# Patient Record
Sex: Female | Born: 1965 | Race: White | Hispanic: No | Smoking: Former smoker
Health system: Southern US, Community
[De-identification: ages and names within clinical notes are randomized; demographics above are authoritative.]

## PROBLEM LIST (undated history)

## (undated) DIAGNOSIS — I739 Peripheral vascular disease, unspecified: Secondary | ICD-10-CM

## (undated) HISTORY — DX: Peripheral vascular disease, unspecified: I73.9

---

## 2009-03-11 ENCOUNTER — Emergency Department (HOSPITAL_BASED_OUTPATIENT_CLINIC_OR_DEPARTMENT_OTHER): Admission: EM | Admit: 2009-03-11 | Discharge: 2009-03-11 | Payer: Self-pay | Admitting: Emergency Medicine

## 2010-11-05 LAB — URINALYSIS, ROUTINE W REFLEX MICROSCOPIC
Ketones, ur: 40 mg/dL — AB
Nitrite: POSITIVE — AB
Protein, ur: >300 mg/dL — AB
Specific Gravity, Urine: 1.022 (ref 1.005–1.030)
Urobilinogen, UA: 4 mg/dL — ABNORMAL HIGH (ref 0.0–1.0)

## 2010-11-05 LAB — URINE MICROSCOPIC-ADD ON

## 2015-06-13 DIAGNOSIS — M67431 Ganglion, right wrist: Secondary | ICD-10-CM | POA: Insufficient documentation

## 2019-10-23 ENCOUNTER — Other Ambulatory Visit: Payer: Self-pay

## 2019-10-23 ENCOUNTER — Encounter (HOSPITAL_BASED_OUTPATIENT_CLINIC_OR_DEPARTMENT_OTHER): Payer: Self-pay

## 2019-10-23 ENCOUNTER — Emergency Department (HOSPITAL_BASED_OUTPATIENT_CLINIC_OR_DEPARTMENT_OTHER)
Admission: EM | Admit: 2019-10-23 | Discharge: 2019-10-23 | Disposition: A | Discharge status: Home / Self Care | Payer: Self-pay | Attending: Emergency Medicine | Admitting: Emergency Medicine

## 2019-10-23 DIAGNOSIS — N3001 Acute cystitis with hematuria: Secondary | ICD-10-CM

## 2019-10-23 DIAGNOSIS — R109 Unspecified abdominal pain: Secondary | ICD-10-CM

## 2019-10-23 DIAGNOSIS — N3091 Cystitis, unspecified with hematuria: Secondary | ICD-10-CM | POA: Insufficient documentation

## 2019-10-23 DIAGNOSIS — F17210 Nicotine dependence, cigarettes, uncomplicated: Secondary | ICD-10-CM | POA: Insufficient documentation

## 2019-10-23 LAB — URINALYSIS, ROUTINE W REFLEX MICROSCOPIC
Bilirubin Urine: NEGATIVE
Glucose, UA: NEGATIVE mg/dL
Ketones, ur: NEGATIVE mg/dL
Leukocytes,Ua: NEGATIVE
Nitrite: NEGATIVE
Protein, ur: NEGATIVE mg/dL
Specific Gravity, Urine: >1.03 — ABNORMAL HIGH (ref 1.005–1.030)
pH: 5.5 (ref 5.0–8.0)

## 2019-10-23 LAB — URINALYSIS, MICROSCOPIC (REFLEX)

## 2019-10-23 MED ORDER — CEPHALEXIN 500 MG PO CAPS: 500.0000 mg | ORAL_CAPSULE | Freq: Two times a day (BID) | ORAL | 0 refills | Status: AC

## 2019-10-23 MED ORDER — CEPHALEXIN 250 MG PO CAPS
500.0000 mg | ORAL_CAPSULE | Freq: Once | ORAL | Status: AC
  Administered 2019-10-23: 20:00:00 500 mg via ORAL
  Filled 2019-10-23: qty 2

## 2019-10-23 NOTE — ED Provider Notes (Signed)
[facility] EMERGENCY DEPARTMENT Provider Note   CSN: [id number] Arrival date & time: 10/23/19  1718     History Chief Complaint  Patient presents with  . Dysuria    Tiffany Forbes is a 54 y.o. female with no pertinent past medical history who presents today for evaluation of urinary symptoms. She reports that for the past 6 days she has had dysuria, urinary frequency and urgency.  She denies hematuria.  She reports that 2 days ago she developed pain in her left flank.  The pain does not radiate or move.  She denies any history of renal stones.  She has not recorded any fevers.  She has tried Azo with minimal relief of her symptoms.  She has not taken any ibuprofen or Tylenol today.  She denies any cough or shortness of breath.  No chest pain.  She reports she did vomit once on Wednesday however reports that was isolated.   She is postmenopausal.  HPI     History reviewed. No pertinent past medical history.  There are no problems to display for this patient.   History reviewed. No pertinent surgical history.   OB History   No obstetric history on file.     No family history on file.  Social History   Tobacco Use  . Smoking status: Current Every Day Smoker    Types: Cigarettes  . Smokeless tobacco: Never Used  Substance Use Topics  . Alcohol use: Never  . Drug use: Never    Home Medications Prior to Admission medications   Medication Sig Start Date End Date Taking? Authorizing Provider  cephALEXin (KEFLEX) 500 MG capsule Take 1 capsule (500 mg total) by mouth 2 (two) times daily for 14 days. 10/23/19 11/06/19  Tiffany Gong, PA-C    Allergies    Patient has no known allergies.  Review of Systems   Review of Systems  Constitutional: Positive for chills. Negative for fever.  Gastrointestinal: Negative for abdominal pain.  Genitourinary: Positive for dysuria, flank pain, frequency and urgency. Negative for decreased urine volume and pelvic pain.    Musculoskeletal: Negative for back pain and neck pain.  Neurological: Negative for weakness and headaches.  All other systems reviewed and are negative.   Physical Exam Updated Vital Signs BP 122/75 (BP Location: Left Arm)   Pulse 96   Temp 98.8 F (37.1 C) (Oral)   Resp 18   Ht 5\' 6"  (1.676 m)   Wt 86.2 kg   SpO2 95%   BMI 30.67 kg/m   Physical Exam Vitals and nursing note reviewed.  Constitutional:      General: She is not in acute distress.    Appearance: She is well-developed. She is not ill-appearing or diaphoretic.  HENT:     Head: Normocephalic and atraumatic.  Eyes:     General: No scleral icterus.       Right eye: No discharge.        Left eye: No discharge.     Conjunctiva/sclera: Conjunctivae normal.  Cardiovascular:     Rate and Rhythm: Normal rate and regular rhythm.     Pulses: Normal pulses.  Pulmonary:     Effort: Pulmonary effort is normal. No respiratory distress.     Breath sounds: No stridor.  Abdominal:     General: There is no distension.     Tenderness: There is no abdominal tenderness. There is left CVA tenderness. There is no right CVA tenderness.  Musculoskeletal:     Cervical  back: Normal range of motion.     Right lower leg: No edema.     Left lower leg: No edema.  Skin:    General: Skin is warm and dry.  Neurological:     General: No focal deficit present.     Mental Status: She is alert. Mental status is at baseline.     Motor: No abnormal muscle tone.  Psychiatric:        Mood and Affect: Mood normal.        Behavior: Behavior normal.     ED Results / Procedures / Treatments   Labs (all labs ordered are listed, but only abnormal results are displayed) Labs Reviewed  URINALYSIS, ROUTINE W REFLEX MICROSCOPIC - Abnormal; Notable for the following components:      Result Value   APPearance HAZY (*)    Specific Gravity, Urine >1.030 (*)    Hgb urine dipstick TRACE (*)    All other components within normal limits  URINALYSIS,  MICROSCOPIC (REFLEX) - Abnormal; Notable for the following components:   Bacteria, UA MANY (*)    All other components within normal limits  URINE CULTURE    EKG None  Radiology No results found.  Procedures Procedures (including critical care time)  Medications Ordered in ED Medications  cephALEXin (KEFLEX) capsule 500 mg (has no administration in time range)    ED Course  I have reviewed the triage vital signs and the nursing notes.  Pertinent labs & imaging results that were available during my care of the patient were reviewed by me and considered in my medical decision making (see chart for details).    MDM Rules/Calculators/A&P                     Tiffany Forbes is a 54 year old woman who presents today for evaluations of 6 days of dysuria, increased frequency urgency with 2 days of left-sided flank pain. On exam she has left-sided CVA tenderness to percussion.  She is afebrile, not tachycardic or tachypneic and not ill-appearing.  She reports this feels similar to previous UTIs. Microscopic exam shows many bacteria with only 6-10 squamous epithelial cells, 0-5 red cells and 0-5 white cells. Based on patient's symptoms, and UA with many bacteria we will treat for urinary tract infection with secondary pyelonephritis. We discussed that if her symptoms do not improve in 2 to 3 days or if at any point she develops fevers or worsens she needs to be seen again for additional evaluation. Urine culture was sent. We also discussed the importance of p.o. hydration as her urine is concentrated and small volume.  No evidence of sepsis at this time.  Return precautions were discussed with patient who states their understanding.  At the time of discharge patient denied any unaddressed complaints or concerns.  Patient is agreeable for discharge home.  Note: Portions of this report may have been transcribed using voice recognition software. Every effort was made to ensure accuracy;  however, inadvertent computerized transcription errors may be present  Final Clinical Impression(s) / ED Diagnoses Final diagnoses:  Acute cystitis with hematuria  Left flank pain    Rx / DC Orders ED Discharge Orders         Ordered    cephALEXin (KEFLEX) 500 MG capsule  2 times daily     10/23/19 1926           Ollen Gross 10/23/19 2257    Fredia Sorrow, MD 11/04/19 [phone]

## 2019-10-23 NOTE — Discharge Instructions (Signed)
You may have diarrhea from the antibiotics.  It is very important that you continue to take the antibiotics even if you get diarrhea unless a medical professional tells you that you may stop taking them.  If you stop too early the bacteria you are being treated for will become stronger and you may need different, more powerful antibiotics that have more side effects and worsening diarrhea.  Please stay well hydrated and consider probiotics as they may decrease the severity of your diarrhea.   °

## 2019-10-23 NOTE — ED Triage Notes (Signed)
Pt c/o dysuria, freq x 6 days-mid back pain x 2 days-NAD-steady gait

## 2019-10-26 LAB — URINE CULTURE: Culture: 20000 — AB

## 2019-10-27 ENCOUNTER — Telehealth: Payer: Self-pay | Admitting: *Deleted

## 2019-10-27 NOTE — Telephone Encounter (Signed)
Post ED Visit - Positive Culture Follow-up  Culture report reviewed by antimicrobial stewardship pharmacist: [facility] []  , Pharm.D. []  Enzo Bi, Pharm.D., BCPS AQ-ID []  , Pharm.D., BCPS []  Celedonio Miyamoto, Pharm.D., BCPS []  [place], Garvin Fila.D., BCPS, AAHIVP []  , Pharm.D., BCPS, AAHIVP []  Georgina Pillion, PharmD, BCPS []  , PharmD, BCPS []  [place], PharmD, BCPS []  [address], PharmD []  , PharmD, BCPS []  Estella Husk, PharmD , [facility] []  , PharmD []  Phillips Climes, PharmD []  , PharmD []  Agapito Games, Rph []  ) Verlan Friends, PharmD []  , PharmD []  Mervyn Gay, PharmD []  , PharmD []  Vinnie Level, PharmD []  Marquette Saa, PharmD []  Retta Diones, PharmD []  , PharmD []  Len Childs, PharmD   Positive urine culture Treated with Cephalexin, organism sensitive to the same and no further patient follow-up is required at this time.  [facility] 10/27/2019, 10:43 AM

## 2020-10-13 LAB — HM MAMMOGRAPHY

## 2021-06-20 ENCOUNTER — Other Ambulatory Visit: Payer: Self-pay

## 2021-06-20 ENCOUNTER — Encounter (HOSPITAL_BASED_OUTPATIENT_CLINIC_OR_DEPARTMENT_OTHER): Payer: Self-pay | Admitting: *Deleted

## 2021-06-20 ENCOUNTER — Emergency Department (HOSPITAL_BASED_OUTPATIENT_CLINIC_OR_DEPARTMENT_OTHER): Payer: Self-pay

## 2021-06-20 ENCOUNTER — Emergency Department (HOSPITAL_BASED_OUTPATIENT_CLINIC_OR_DEPARTMENT_OTHER)
Admission: EM | Admit: 2021-06-20 | Discharge: 2021-06-20 | Disposition: A | Discharge status: Home / Self Care | Payer: Self-pay | Attending: Emergency Medicine | Admitting: Emergency Medicine

## 2021-06-20 DIAGNOSIS — F17210 Nicotine dependence, cigarettes, uncomplicated: Secondary | ICD-10-CM

## 2021-06-20 DIAGNOSIS — I739 Peripheral vascular disease, unspecified: Secondary | ICD-10-CM | POA: Insufficient documentation

## 2021-06-20 DIAGNOSIS — M79671 Pain in right foot: Secondary | ICD-10-CM | POA: Insufficient documentation

## 2021-06-20 DIAGNOSIS — M79604 Pain in right leg: Secondary | ICD-10-CM | POA: Insufficient documentation

## 2021-06-20 LAB — COMPREHENSIVE METABOLIC PANEL
ALT: 25 U/L (ref 0–44)
AST: 22 U/L (ref 15–41)
Albumin: 4.1 g/dL (ref 3.5–5.0)
Alkaline Phosphatase: 79 U/L (ref 38–126)
Anion gap: 10 (ref 5–15)
BUN: 15 mg/dL (ref 6–20)
CO2: 24 mmol/L (ref 22–32)
Calcium: 9 mg/dL (ref 8.9–10.3)
Chloride: 101 mmol/L (ref 98–111)
Creatinine, Ser: 0.73 mg/dL (ref 0.44–1.00)
GFR, Estimated: >60 mL/min (ref >60)
Glucose, Bld: 98 mg/dL (ref 70–99)
Potassium: 3.8 mmol/L (ref 3.5–5.1)
Sodium: 135 mmol/L (ref 135–145)
Total Bilirubin: 0.4 mg/dL (ref 0.3–1.2)
Total Protein: 7.4 g/dL (ref 6.5–8.1)

## 2021-06-20 LAB — CBC WITH DIFFERENTIAL/PLATELET
Abs Immature Granulocytes: 0.04 10*3/uL (ref 0.00–0.07)
Basophils Absolute: 0.1 10*3/uL (ref 0.0–0.1)
Basophils Relative: 1 %
Eosinophils Absolute: 0.2 10*3/uL (ref 0.0–0.5)
Eosinophils Relative: 1 %
HCT: 41.5 % (ref 36.0–46.0)
Hemoglobin: 14.2 g/dL (ref 12.0–15.0)
Immature Granulocytes: 0 %
Lymphocytes Relative: 35 %
Lymphs Abs: 4.1 10*3/uL — ABNORMAL HIGH (ref 0.7–4.0)
MCH: 33.6 pg (ref 26.0–34.0)
MCHC: 34.2 g/dL (ref 30.0–36.0)
MCV: 98.3 fL (ref 80.0–100.0)
Monocytes Absolute: 0.6 10*3/uL (ref 0.1–1.0)
Monocytes Relative: 5 %
Neutro Abs: 6.8 10*3/uL (ref 1.7–7.7)
Neutrophils Relative %: 58 %
Platelets: 258 10*3/uL (ref 150–400)
RBC: 4.22 MIL/uL (ref 3.87–5.11)
RDW: 12.2 % (ref 11.5–15.5)
WBC: 11.9 10*3/uL — ABNORMAL HIGH (ref 4.0–10.5)
nRBC: 0 % (ref 0.0–0.2)

## 2021-06-20 NOTE — Discharge Instructions (Signed)
You are seen in the ER for leg pain.  Please go to [facility] ER and have the team consult vascular surgery. Vascular surgery team will see you and make recommendations on the next best step.

## 2021-06-20 NOTE — ED Notes (Signed)
Pt to be transferred to [facility] ED  for evaluation by vascular surgeon.  Pt to travel by private vehicle with PIV in place.

## 2021-06-20 NOTE — ED Triage Notes (Signed)
C/o right calf pain x 2 months , purple toes x 1 week

## 2021-06-20 NOTE — ED Notes (Signed)
Report called to Asher Muir RN charge at [facility] ED.  Pt transferred via private vehicle

## 2021-06-20 NOTE — ED Notes (Signed)
US in progress at bedside.

## 2021-06-20 NOTE — ED Notes (Signed)
Dr Bernette Mayers ED doc accepting at [facility] ED

## 2021-06-20 NOTE — Consult Note (Signed)
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  Certainly she could have atheroembolic disease from a proximal stenosis.  For this reason I recommended that we proceed with arteriography which we can schedule as an outpatient next week.   I have reviewed with the patient the indications for arteriography. In addition, I have reviewed the potential complications of arteriography including but not limited to: Bleeding, arterial injury, arterial thrombosis, dye action, renal insufficiency, or other unpredictable medical problems. I have explained to the patient that if we find disease amenable to angioplasty we could potentially address this at the same time. I have discussed the potential complications of angioplasty and stenting, including but not limited to: Bleeding, arterial thrombosis, arterial injury, dissection, or the need for surgical intervention.  We have discussed the importance of tobacco cessation.  I have also encouraged her to begin taking aspirin daily.  I think she would also benefit from a low-dose statin.  We will make further recommendations pending the results of her arteriogram next week which will be done as an outpatient.  REASON FOR CONSULT:    Discoloration to the right foot.  The consult is requested by [facility].  HPI:   Tiffany Forbes is a 55 y.o. female who noted the gradual onset of right calf claudication at the end of October.  The symptoms would occur after walking about 2 blocks.  She denies any thigh or hip claudication.  She denies any symptoms on the left side.  The symptoms persisted.  Today she noted some bluish discoloration of her toes and therefore went to the med [facility] emergency department.  He was complaining of calf pain which  prompted a duplex scan which showed no evidence of DVT.  However given the discoloration of the toes she was transferred to [facility] health for vascular consultation.  The patient has the right calf claudication which she has had for about a month now.  She denies any history of rest pain.  She denies any history of nonhealing wounds. Her risk factors for peripheral vascular disease and clued a history of tobacco use.  She currently smokes a half a pack per day and has been smoking this for about 2 years.  Prior to that she had smoked a pack per day for about 30 years.  She denies any history of diabetes, hypertension, or family history of premature cardiovascular disease.   History reviewed. No pertinent past medical history.  History reviewed. No pertinent family history.  SOCIAL HISTORY: Social History   Tobacco Use   Smoking status: Every Day    Types: Cigarettes   Smokeless tobacco: Never  Substance Use Topics   Alcohol use: Never    No Known Allergies  No current facility-administered medications for this encounter.   No current outpatient medications on file.    REVIEW OF SYSTEMS:  [X]  denotes positive finding, [ ]  denotes negative finding Cardiac  Comments:  Chest pain or chest pressure:    Shortness of breath upon exertion:    Short of breath when lying flat:    Irregular heart rhythm:        Vascular    Pain in calf, thigh, or hip brought on by ambulation: x   Pain in feet at night that wakes you  up from your sleep:     Blood clot in your veins:    Leg swelling:         Pulmonary    Oxygen at home:    Productive cough:     Wheezing:         Neurologic    Sudden weakness in arms or legs:     Sudden numbness in arms or legs:     Sudden onset of difficulty speaking or slurred speech:    Temporary loss of vision in one eye:     Problems with dizziness:         Gastrointestinal    Blood in stool:     Vomited blood:         Genitourinary    Burning when  urinating:     Blood in urine:        Psychiatric    Major depression:         Hematologic    Bleeding problems:    Problems with blood clotting too easily:        Skin    Rashes or ulcers:        Constitutional    Fever or chills:    -  PHYSICAL EXAM:   Vitals:   06/20/21 1531 06/20/21 1546 06/20/21 1645 06/20/21 1815  BP: 118/64 134/69 124/74 129/80  Pulse: 84 (!) 102 86 84  Resp: 18 16 16 19   Temp: 98.1 F (36.7 C) 98 F (36.7 C)    TempSrc: Oral     SpO2: 96% 100% 99% 97%  Weight:      Height:       Body mass index is 29.76 kg/m. GENERAL: The patient is a well-nourished female, in no acute distress. The vital signs are documented above. CARDIAC: There is a regular rate and rhythm.  VASCULAR: I do not detect carotid bruits. On the right side, which is the symptomatic side, she has a palpable femoral pulse.  I cannot palpate a popliteal pulse.  She has a brisk posterior tibial and dorsalis pedis signal with the Doppler although these are monophasic. On the left side, she has a palpable femoral, popliteal, and dorsalis pedis pulse.  She has biphasic signals in the left foot. She has some slight discoloration to multiple toes on the right foot. PULMONARY: There is good air exchange bilaterally without wheezing or rales. ABDOMEN: Soft and non-tender with normal pitched bowel sounds.  I do not palpate an abdominal aortic aneurysm. MUSCULOSKELETAL: There are no major deformities. NEUROLOGIC: No focal weakness or paresthesias are detected. SKIN: There are no ulcers or rashes noted. PSYCHIATRIC: The patient has a normal affect.  DATA:    LABS: I reviewed her labs from the referring emergency department.  Her creatinine was 0.73.  Her white count was 12.  VENOUS DUPLEX: Venous duplex scan showed no evidence of DVT in the right leg.  [facility]

## 2021-06-20 NOTE — ED Provider Notes (Signed)
[facility] EMERGENCY DEPARTMENT Provider Note   CSN: [id number] Arrival date & time: 06/20/21  1252     History Chief Complaint  Patient presents with   Leg Pain    Tiffany Forbes is a 55 y.o. female.  HPI    55 year old female comes in with chief complaint of leg pain.  Patient has no significant medical history.  She used to smoke a pack a day for about 30 years, quit about 2 years ago.  Patient reports that on Halloween she started noticing some pain with ambulation when she was out trick-or-treating.  Since then, she is appreciated the pain to be becoming more severe and with less exertion.  She also noted some discoloration in her foot about a week or 2 ago.  Her pain is described as burning type pain in her right foot.  She also has some pain in her calf with ambulation.  Pt has no hx of PE, DVT and denies any exogenous hormone (testosterone / estrogen) use, long distance travels or surgery in the past 6 weeks, active cancer, recent immobilization.   History reviewed. No pertinent past medical history.  There are no problems to display for this patient.   History reviewed. No pertinent surgical history.   OB History   No obstetric history on file.     No family history on file.  Social History   Tobacco Use   Smoking status: Every Day    Types: Cigarettes   Smokeless tobacco: Never  Vaping Use   Vaping Use: Never used  Substance Use Topics   Alcohol use: Never   Drug use: Never    Home Medications Prior to Admission medications   Not on File    Allergies    Patient has no known allergies.  Review of Systems   Review of Systems  Constitutional:  Positive for activity change.  Musculoskeletal:  Positive for arthralgias and myalgias.  Skin:  Positive for rash.  Hematological:  Does not bruise/bleed easily.  All other systems reviewed and are negative.  Physical Exam Updated Vital Signs BP 118/64 (BP Location: Right Arm)   Pulse  84   Temp 98.1 F (36.7 C) (Oral)   Resp 18   Ht 5\' 7"  (1.702 m)   Wt 86.2 kg   SpO2 96%   BMI 29.76 kg/m   Physical Exam Vitals and nursing note reviewed.  Constitutional:      Appearance: She is well-developed.  HENT:     Head: Atraumatic.  Cardiovascular:     Rate and Rhythm: Normal rate.  Pulmonary:     Effort: Pulmonary effort is normal.  Musculoskeletal:     Cervical back: Normal range of motion and neck supple.     Comments: Patient has tenderness over the right calf with palpation.  No unilateral swelling noted. Patient also noted to have some hyperpigmentation of the toes and proximal aspect of the plantar surface. Skin is warm to touch.  Dopplerable dorsalis pedis..  Skin:    General: Skin is warm and dry.  Neurological:     Mental Status: She is alert and oriented to person, place, and time.    ED Results / Procedures / Treatments   Labs (all labs ordered are listed, but only abnormal results are displayed) Labs Reviewed  CBC WITH DIFFERENTIAL/PLATELET - Abnormal; Notable for the following components:      Result Value   WBC 11.9 (*)    Lymphs Abs 4.1 (*)    All  other components within normal limits  COMPREHENSIVE METABOLIC PANEL    EKG None  Radiology No results found.  Procedures Procedures   Medications Ordered in ED Medications - No data to display  ED Course  I have reviewed the triage vital signs and the nursing notes.  Pertinent labs & imaging results that were available during my care of the patient were reviewed by me and considered in my medical decision making (see chart for details).    MDM Rules/Calculators/A&P                           55 year old female comes in with chief complaint of leg pain.  She has discoloration to her leg as well.  No history of DVT, PE. Patient indicates that she used to be a heavy smoker.  Does not see PCP regularly.  On evaluation, she has a dopplerable dorsalis pedis on the right side while it  is palpable on the left side.  Clinical concerns are for arterial insufficiency.  Ultrasound DVT also ordered right now.  I spoke with Dr. Durwin Nora, vascular surgery.  He is requesting that the patient be transferred to [facility], ED for assessment.  Patient has warm extremity, no pain when she is not walking.  There is no critical limb ischemia based on my exam, and we will allow her to drive to [facility], ED.  I spoke with Dr. Bernette Mayers at [facility], ED. Requesting [facility], ED doctor to follow-up on the ultrasound result, as patient will leave as soon as ultrasound is completed.   Final Clinical Impression(s) / ED Diagnoses Final diagnoses:  Claudication [facility])    Rx / DC Orders ED Discharge Orders     None        Derwood Kaplan, MD 06/20/21 1549

## 2021-06-20 NOTE — ED Notes (Signed)
ED Provider at bedside. 

## 2021-06-21 ENCOUNTER — Other Ambulatory Visit: Payer: Self-pay

## 2021-06-28 ENCOUNTER — Ambulatory Visit (HOSPITAL_COMMUNITY)
Admission: RE | Admit: 2021-06-28 | Discharge: 2021-06-28 | Disposition: A | Discharge status: Home / Self Care | Payer: Self-pay | Source: Ambulatory Visit | Attending: Vascular Surgery | Admitting: Vascular Surgery

## 2021-06-28 ENCOUNTER — Encounter (HOSPITAL_COMMUNITY): Payer: Self-pay | Admitting: Vascular Surgery

## 2021-06-28 ENCOUNTER — Encounter (HOSPITAL_COMMUNITY)
Admission: RE | Disposition: A | Discharge status: Home / Self Care | Payer: Self-pay | Source: Ambulatory Visit | Attending: Vascular Surgery

## 2021-06-28 DIAGNOSIS — I743 Embolism and thrombosis of arteries of the lower extremities: Secondary | ICD-10-CM

## 2021-06-28 DIAGNOSIS — I75021 Atheroembolism of right lower extremity: Secondary | ICD-10-CM | POA: Insufficient documentation

## 2021-06-28 DIAGNOSIS — F17210 Nicotine dependence, cigarettes, uncomplicated: Secondary | ICD-10-CM | POA: Insufficient documentation

## 2021-06-28 HISTORY — PX: ABDOMINAL AORTOGRAM W/LOWER EXTREMITY: CATH118223

## 2021-06-28 HISTORY — PX: PERIPHERAL VASCULAR INTERVENTION: CATH118257

## 2021-06-28 LAB — POCT I-STAT, CHEM 8
BUN: 12 mg/dL (ref 6–20)
Calcium, Ion: 1.2 mmol/L (ref 1.15–1.40)
Chloride: 104 mmol/L (ref 98–111)
Creatinine, Ser: 0.5 mg/dL (ref 0.44–1.00)
Glucose, Bld: 121 mg/dL — ABNORMAL HIGH (ref 70–99)
HCT: 41 % (ref 36.0–46.0)
Hemoglobin: 13.9 g/dL (ref 12.0–15.0)
Potassium: 3.9 mmol/L (ref 3.5–5.1)
Sodium: 141 mmol/L (ref 135–145)
TCO2: 26 mmol/L (ref 22–32)

## 2021-06-28 SURGERY — ABDOMINAL AORTOGRAM W/LOWER EXTREMITY
Anesthesia: LOCAL | Laterality: Right

## 2021-06-28 MED ORDER — SODIUM CHLORIDE 0.9 % IV SOLN: INTRAVENOUS | Status: DC

## 2021-06-28 MED ORDER — CLOPIDOGREL BISULFATE 75 MG PO TABS: 75.0000 mg | ORAL_TABLET | Freq: Every day | ORAL | 11 refills | Status: AC

## 2021-06-28 MED ORDER — ATORVASTATIN CALCIUM 80 MG PO TABS: 80.0000 mg | ORAL_TABLET | Freq: Every day | ORAL | Status: DC

## 2021-06-28 MED ORDER — HEPARIN SODIUM (PORCINE) 1000 UNIT/ML IJ SOLN
INTRAMUSCULAR | Status: AC
  Filled 2021-06-28: qty 10

## 2021-06-28 MED ORDER — ATORVASTATIN CALCIUM 80 MG PO TABS: 80.0000 mg | ORAL_TABLET | Freq: Every day | ORAL | 11 refills | Status: DC

## 2021-06-28 MED ORDER — SODIUM CHLORIDE 0.9% FLUSH: 3.0000 mL | Freq: Two times a day (BID) | INTRAVENOUS | Status: DC

## 2021-06-28 MED ORDER — MIDAZOLAM HCL 2 MG/2ML IJ SOLN
INTRAMUSCULAR | Status: DC | PRN
  Administered 2021-06-28 (×2): 1 mg via INTRAVENOUS

## 2021-06-28 MED ORDER — FENTANYL CITRATE (PF) 100 MCG/2ML IJ SOLN
INTRAMUSCULAR | Status: AC
  Filled 2021-06-28: qty 2

## 2021-06-28 MED ORDER — CLOPIDOGREL BISULFATE 300 MG PO TABS
ORAL_TABLET | ORAL | Status: AC
  Filled 2021-06-28: qty 1

## 2021-06-28 MED ORDER — LIDOCAINE HCL (PF) 1 % IJ SOLN
INTRAMUSCULAR | Status: AC
  Filled 2021-06-28: qty 30

## 2021-06-28 MED ORDER — HEPARIN (PORCINE) IN NACL 1000-0.9 UT/500ML-% IV SOLN
INTRAVENOUS | Status: DC | PRN
  Administered 2021-06-28 (×2): 500 mL

## 2021-06-28 MED ORDER — HEPARIN (PORCINE) IN NACL 1000-0.9 UT/500ML-% IV SOLN
INTRAVENOUS | Status: AC
  Filled 2021-06-28: qty 1000

## 2021-06-28 MED ORDER — SODIUM CHLORIDE 0.9% FLUSH: 3.0000 mL | INTRAVENOUS | Status: DC | PRN

## 2021-06-28 MED ORDER — HYDRALAZINE HCL 20 MG/ML IJ SOLN: 5.0000 mg | INTRAMUSCULAR | Status: DC | PRN

## 2021-06-28 MED ORDER — SODIUM CHLORIDE 0.9 % IV SOLN: 250.0000 mL | INTRAVENOUS | Status: DC | PRN

## 2021-06-28 MED ORDER — IODIXANOL 320 MG/ML IV SOLN
INTRAVENOUS | Status: DC | PRN
  Administered 2021-06-28: 100 mL

## 2021-06-28 MED ORDER — LABETALOL HCL 5 MG/ML IV SOLN: 10.0000 mg | INTRAVENOUS | Status: DC | PRN

## 2021-06-28 MED ORDER — CLOPIDOGREL BISULFATE 300 MG PO TABS
ORAL_TABLET | ORAL | Status: DC | PRN
  Administered 2021-06-28: 300 mg via ORAL

## 2021-06-28 MED ORDER — LIDOCAINE HCL (PF) 1 % IJ SOLN
INTRAMUSCULAR | Status: DC | PRN
  Administered 2021-06-28: 5 mL via INTRADERMAL

## 2021-06-28 MED ORDER — MIDAZOLAM HCL 2 MG/2ML IJ SOLN
INTRAMUSCULAR | Status: AC
  Filled 2021-06-28: qty 2

## 2021-06-28 MED ORDER — ONDANSETRON HCL 4 MG/2ML IJ SOLN: 4.0000 mg | Freq: Four times a day (QID) | INTRAMUSCULAR | Status: DC | PRN

## 2021-06-28 MED ORDER — HEPARIN SODIUM (PORCINE) 1000 UNIT/ML IJ SOLN
INTRAMUSCULAR | Status: DC | PRN
  Administered 2021-06-28: 8000 [IU] via INTRAVENOUS

## 2021-06-28 MED ORDER — FENTANYL CITRATE (PF) 100 MCG/2ML IJ SOLN
INTRAMUSCULAR | Status: DC | PRN
  Administered 2021-06-28: 50 ug via INTRAVENOUS

## 2021-06-28 MED ORDER — ACETAMINOPHEN 325 MG PO TABS: 650.0000 mg | ORAL_TABLET | ORAL | Status: DC | PRN

## 2021-06-28 MED ORDER — SODIUM CHLORIDE 0.9 % WEIGHT BASED INFUSION: 1.0000 mL/kg/h | INTRAVENOUS | Status: DC

## 2021-06-28 SURGICAL SUPPLY — 18 items
BALLN MUSTANG 5.0X40 135 (BALLOONS) ×3
BALLOON MUSTANG 5.0X40 135 (BALLOONS) ×2 IMPLANT
CATH OMNI FLUSH 5F 65CM (CATHETERS) ×3 IMPLANT
CATH QUICKCROSS SUPP .035X90CM (MICROCATHETER) ×3 IMPLANT
DEVICE CLOSURE MYNXGRIP 6/7F (Vascular Products) ×3 IMPLANT
GLIDEWIRE ADV .035X260CM (WIRE) ×3 IMPLANT
KIT ENCORE 26 ADVANTAGE (KITS) ×3 IMPLANT
KIT MICROPUNCTURE NIT STIFF (SHEATH) ×3 IMPLANT
KIT PV (KITS) ×3 IMPLANT
SHEATH PINNACLE 5F 10CM (SHEATH) ×3 IMPLANT
SHEATH PINNACLE 6F 10CM (SHEATH) ×3 IMPLANT
SHEATH PINNACLE ST 6F 45CM (SHEATH) ×3 IMPLANT
SHEATH PROBE COVER 6X72 (BAG) ×3 IMPLANT
STENT INNOVA 5X40X130 (Permanent Stent) ×3 IMPLANT
SYR MEDRAD MARK V 150ML (SYRINGE) ×3 IMPLANT
TRANSDUCER W/STOPCOCK (MISCELLANEOUS) ×3 IMPLANT
TRAY PV CATH (CUSTOM PROCEDURE TRAY) ×3 IMPLANT
WIRE BENTSON .035X145CM (WIRE) ×3 IMPLANT

## 2021-06-28 NOTE — Op Note (Addendum)
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  Ultrasound-guided micropuncture access of the left common femoral artery 2.  Aortogram 3.  Right lower extremity angiogram 4.  Superficial femoral artery stenting - 5 x 40 mm Innova 5.  Post dilation of stent with a 5 x 40 mm balloon 6.  Left common femoral artery angiogram 6.  device assisted closure-Mynx 40 minutes moderate sedation   Indications: Patient is a 55 year old female who was seen last week with discoloration in her right toes.  Physical exam demonstrated nonpalpable pulse in the right foot, and popliteal arteries.  After discussing the risks and benefits of right lower extremity angiogram, to define and treat possible nidus of atheroembolic event, Jenisa elected to proceed  Findings:   Aortogram: Normal bilateral renal arteries, normal infrarenal abdominal aorta.  Small bilateral common iliac arteries, small hypogastric arteries, small external iliac arteries. No flow-limiting stenosis appreciated within arterial inflow.  Right leg: Normal common femoral artery, normal profunda, proximal portion of the superficial femoral artery was normal.  The distal superficial femoral artery demonstrated a 1 cm flow-limiting lesion.  This was soft, and likely the nidus for her blue toe syndrome.  Distally, the popliteal artery was normal.  Anterior tibial artery was normal with sluggish flow.  There is a lesion appreciated in the dorsalis pedis artery.  The peroneal and posterior tibial arteries were normal with brisk filling of the foot.  Left leg: Short common femoral artery, with high femoral bifurcation.  Normal profunda, normal proximal superficial femoral artery   Procedure:  The patient was identified in the holding area and taken to room  8.  The patient was then placed supine on the table and prepped and draped in the usual sterile fashion.  A time out was called.  Ultrasound was used to evaluate the left common femoral artery.  It was patent .  A digital ultrasound image was acquired.  A micropuncture needle was used to access the left common femoral artery under ultrasound guidance.  An 018 wire was advanced without resistance and a micropuncture sheath was placed.  The 018 wire was removed and a benson wire was placed.  The micropuncture sheath was exchanged for a 5 french sheath.  An omniflush catheter was advanced over the wire to the level of L-1.  An abdominal angiogram was obtained.  Next, using the omniflush catheter and a benson wire, the aortic bifurcation was crossed and the catheter was placed into theright external iliac artery and right runoff was obtained.  Results above.  I made the decision to intervene on the distal superficial femoral artery.  The patient was heparinized with 8000 units IV heparin.  A 6 x 45 cm sheath was brought to the field and parked in the right common femoral artery.  Using a Glidewire advantage and quick cross catheter, the lesion was crossed without issue.  The lesion appeared to be soft, and likely the nidus for the patient's right sided blue toe syndrome.  Since the area was not atherosclerotic, and the artery was otherwise normal, I made the decision to primarily stent in an effort to trap the contents of the lesion behind the stent.  A 5 x 40 mm Innova stent was brought to the field and deployed across the lesion without issue.  This was postdilated using a 5 x  40 balloon.  This proved to be an excellent size match.  Completion angiography demonstrated excellent result with resolution of the flow-limiting lesion.  There was improved flow distally with no new embolic event appreciated.  Prior to access management, the left common femoral artery was assessed with angiography confirming arterial  access in the left common femoral artery.  I made the decision to close the patient using a minx device.  This was deployed without issue.  The patient was started on Plavix postoperatively.  She will continue on dual antiplatelet therapy lifelong.  I will see the patient in 1 month with repeat ABI and right lower extremity arterial duplex ultrasonography.   Fara Olden, MD [facility] [place] Office: [phone]

## 2021-06-28 NOTE — H&P (Signed)
Patient seen and examined in preop holding.  No complaints. In short, pt with discoloration at the distal aspects of the right toes. 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 No palpable pulse in the foot. Palpable pulse in the left. Pt with short distance claudication. No changes to medication history or physical exam since last seen in clinic. Will pursue angiography to identify possible nidus. After discussing the risks and benefits of right lower extremity angiogram, Tiffany Forbes elected to proceed.    Victorino Sparrow MD   ASSESSMENT & PLAN   ATHEROEMBOLIC DISEASE: Although the patient has excellent Doppler signals in the right foot I think she does have evidence of some infrainguinal arterial occlusive disease on the right as I am unable to palpate a popliteal pulse.  Certainly she could have atheroembolic disease from a proximal stenosis.  For this reason I recommended that we proceed with arteriography which we can schedule as an outpatient next week.   I have reviewed with the patient the indications for arteriography. In addition, I have reviewed the potential complications of arteriography including but not limited to: Bleeding, arterial injury, arterial thrombosis, dye action, renal insufficiency, or other unpredictable medical problems. I have explained to the patient that if we find disease amenable to angioplasty we could potentially address this at the same time. I have discussed the potential complications of angioplasty and stenting, including but not limited to: Bleeding, arterial thrombosis, arterial injury, dissection, or the need for surgical intervention.  We have discussed the importance of tobacco cessation.  I have also encouraged her to begin taking aspirin daily.  I think she would also benefit from a low-dose statin.  We will make further recommendations pending the results of her arteriogram next week which will be done as an outpatient.  REASON FOR  CONSULT:    Discoloration to the right foot.  The consult is requested by [facility].  HPI:   Tiffany Forbes is a 55 y.o. female who noted the gradual onset of right calf claudication at the end of October.  The symptoms would occur after walking about 2 blocks.  She denies any thigh or hip claudication.  She denies any symptoms on the left side.  The symptoms persisted.  Today she noted some bluish discoloration of her toes and therefore went to the med [facility] emergency department.  He was complaining of calf pain which prompted a duplex scan which showed no evidence of DVT.  However given the discoloration of the toes she was transferred to [facility] for vascular consultation.  The patient has the right calf claudication which she has had for about a month now.  She denies any history of rest pain.  She denies any history of nonhealing wounds. Her risk factors for peripheral vascular disease and clued a history of tobacco use.  She currently smokes a half a pack per day and has been smoking this for about 2 years.  Prior to that she had smoked a pack per day for about 30 years.  She denies any history of diabetes, hypertension, or family history of premature cardiovascular disease.   No past medical history on file.  No family history on file.  SOCIAL HISTORY: Social History   Tobacco Use   Smoking status: Every Day    Types: Cigarettes   Smokeless tobacco: Never  Substance Use Topics   Alcohol use: Never    No Known Allergies  Current Facility-Administered Medications  Medication Dose Route Frequency Provider Last Rate Last Admin   0.9 %  sodium chloride infusion   Intravenous Continuous Victorino Sparrow, MD 100 mL/hr at 06/28/21 0557 New Bag at 06/28/21 0557    REVIEW OF SYSTEMS:  [X]  denotes positive finding, [ ]  denotes negative finding Cardiac  Comments:  Chest pain or chest pressure:    Shortness of breath upon exertion:    Short of breath  when lying flat:    Irregular heart rhythm:        Vascular    Pain in calf, thigh, or hip brought on by ambulation: x   Pain in feet at night that wakes you up from your sleep:     Blood clot in your veins:    Leg swelling:         Pulmonary    Oxygen at home:    Productive cough:     Wheezing:         Neurologic    Sudden weakness in arms or legs:     Sudden numbness in arms or legs:     Sudden onset of difficulty speaking or slurred speech:    Temporary loss of vision in one eye:     Problems with dizziness:         Gastrointestinal    Blood in stool:     Vomited blood:         Genitourinary    Burning when urinating:     Blood in urine:        Psychiatric    Major depression:         Hematologic    Bleeding problems:    Problems with blood clotting too easily:        Skin    Rashes or ulcers:        Constitutional    Fever or chills:    -  PHYSICAL EXAM:   Vitals:   06/28/21 0547  BP: 121/75  Pulse: 76  Resp: 18  Temp: 97.7 F (36.5 C)  TempSrc: Oral  SpO2: 98%  Weight: 86.2 kg  Height: 5\' 6"  (1.676 m)   Body mass index is 30.67 kg/m. GENERAL: The patient is a well-nourished female, in no acute distress. The vital signs are documented above. CARDIAC: There is a regular rate and rhythm.  VASCULAR: I do not detect carotid bruits. On the right side, which is the symptomatic side, she has a palpable femoral pulse.  I cannot palpate a popliteal pulse.  She has a brisk posterior tibial and dorsalis pedis signal with the Doppler although these are monophasic. On the left side, she has a palpable femoral, popliteal, and dorsalis pedis pulse.  She has biphasic signals in the left foot. She has some slight discoloration to multiple toes on the right foot. PULMONARY: There is good air exchange bilaterally without wheezing or rales. ABDOMEN: Soft and non-tender with normal pitched bowel sounds.  I do not palpate an abdominal aortic aneurysm. MUSCULOSKELETAL:  There are no major deformities. NEUROLOGIC: No focal weakness or paresthesias are detected. SKIN: There are no ulcers or rashes noted. PSYCHIATRIC: The patient has a normal affect.  DATA:    LABS: I reviewed her labs from the referring emergency department.  Her creatinine was 0.73.  Her white count was 12.  VENOUS DUPLEX: Venous duplex scan showed no evidence of DVT in the right leg.  [facility] [place]

## 2021-07-15 ENCOUNTER — Other Ambulatory Visit: Payer: Self-pay

## 2021-07-15 DIAGNOSIS — I739 Peripheral vascular disease, unspecified: Secondary | ICD-10-CM

## 2021-07-28 ENCOUNTER — Ambulatory Visit (INDEPENDENT_AMBULATORY_CARE_PROVIDER_SITE_OTHER): Payer: Self-pay | Admitting: Physician Assistant

## 2021-07-28 ENCOUNTER — Ambulatory Visit (HOSPITAL_COMMUNITY)
Admission: RE | Admit: 2021-07-28 | Discharge: 2021-07-28 | Disposition: A | Discharge status: Home / Self Care | Payer: Self-pay | Source: Ambulatory Visit | Attending: Vascular Surgery | Admitting: Vascular Surgery

## 2021-07-28 ENCOUNTER — Ambulatory Visit (INDEPENDENT_AMBULATORY_CARE_PROVIDER_SITE_OTHER)
Admission: RE | Admit: 2021-07-28 | Discharge: 2021-07-28 | Disposition: A | Discharge status: Home / Self Care | Payer: Self-pay | Source: Ambulatory Visit | Attending: Vascular Surgery | Admitting: Vascular Surgery

## 2021-07-28 ENCOUNTER — Encounter: Payer: Self-pay | Admitting: Physician Assistant

## 2021-07-28 ENCOUNTER — Other Ambulatory Visit: Payer: Self-pay

## 2021-07-28 VITALS — BP 135/81 | HR 63 | Temp 98.0°F | Resp 20 | Ht 66.0 in | Wt 196.1 lb

## 2021-07-28 DIAGNOSIS — I739 Peripheral vascular disease, unspecified: Secondary | ICD-10-CM | POA: Insufficient documentation

## 2021-07-28 NOTE — Progress Notes (Signed)
[facility] [place] HISTORY AND PHYSICAL   History of Present Illness:  Patient is a 55 y.o. year old female who presents for evaluation of right LE claudication with bluish discoloration of her toes.  She denies any history of rest pain.  She denies any history of nonhealing wounds.  She is s/p angiogram of the right LE with Superficial femoral artery stenting - 5 x 40 mm Innova and balloon angioplasty.  She has stopped smoking "cold [place]."  She has no residual symptoms of claudication even after walking 4 miles.  She has burning and tingling in her toes on the right foot that seems to be dissipating with time.    She states she feels more bloated since she has started taking the Lipitor, Plavix and ASA.   She will continue to take it, eat better and walk for exercise.     History reviewed. No pertinent past medical history.  Past Surgical History:  Procedure Laterality Date   ABDOMINAL AORTOGRAM W/LOWER EXTREMITY N/A 06/28/2021   Procedure: ABDOMINAL AORTOGRAM W/LOWER EXTREMITY;  Surgeon: Victorino Sparrow, MD;  Location[facility] INVASIVE CV LAB;  Service: Cardiovascular;  Laterality: N/A;   PERIPHERAL VASCULAR INTERVENTION Right 06/28/2021   Procedure: PERIPHERAL VASCULAR INTERVENTION;  Surgeon: Victorino Sparrow, MD;  Location: [facility] INVASIVE CV LAB;  Service: Cardiovascular;  Laterality: Right;    ROS:   General:  No weight loss, Fever, chills  HEENT: No recent headaches, no nasal bleeding, no visual changes, no sore throat  Neurologic: No dizziness, blackouts, seizures. No recent symptoms of stroke or mini- stroke. No recent episodes of slurred speech, or temporary blindness.  Cardiac: No recent episodes of chest pain/pressure, no shortness of breath at rest.  No shortness of breath with exertion.  Denies history of atrial fibrillation or irregular heartbeat  Vascular: No history of rest pain in feet.  No history of claudication.  No history of non-healing ulcer, No  history of DVT   Pulmonary: No home oxygen, no productive cough, no hemoptysis,  No asthma or wheezing  Musculoskeletal:  [ ]  Arthritis, [ ]  Low back pain,  [ ]  Joint pain  Hematologic:No history of hypercoagulable state.  No history of easy bleeding.  No history of anemia  Gastrointestinal: No hematochezia or melena,  No gastroesophageal reflux, no trouble swallowing  Urinary: [ ]  chronic Kidney disease, [ ]  on HD - [ ]  MWF or [ ]  TTHS, [ ]  Burning with urination, [ ]  Frequent urination, [ ]  Difficulty urinating;   Skin: No rashes  Psychological: No history of anxiety,  No history of depression  Social History Social History   Tobacco Use   Smoking status: Former    Types: Cigarettes    Quit date: 06/28/2021    Years since quitting: 0.0   Smokeless tobacco: Never  Vaping Use   Vaping Use: Never used  Substance Use Topics   Alcohol use: Never   Drug use: Never    Family History History reviewed. No pertinent family history.  Allergies  No Known Allergies   Current Outpatient Medications  Medication Sig Dispense Refill   aspirin EC 81 MG tablet Take 81 mg by mouth daily. Swallow whole.     atorvastatin (LIPITOR) 80 MG tablet Take 1 tablet (80 mg total) by mouth daily. 30 tablet 11   clopidogrel (PLAVIX) 75 MG tablet Take 1 tablet (75 mg total) by mouth daily. 30 tablet 11   No current facility-administered medications for this visit.  Physical Examination  Vitals:   07/28/21 1211  BP: 135/81  Pulse: 63  Resp: 20  Temp: 98 F (36.7 C)  TempSrc: Temporal  SpO2: 97%  Weight: 196 lb 1.6 oz (89 kg)  Height: 5\' 6"  (1.676 m)    Body mass index is 31.65 kg/m.  General:  Alert and oriented, no acute distress HEENT: Normal Neck: No bruit or JVD Pulmonary: Clear to auscultation bilaterally Cardiac: Regular Rate and Rhythm without murmur Abdomen: Soft, non-tender, non-distended, no mass, no scars Skin: No rash  Musculoskeletal: No deformity or  edema  Neurologic: Upper and lower extremity motor 5/5 and symmetric  06/28/21 Aortogram: Normal bilateral renal arteries, normal infrarenal abdominal aorta.  Small bilateral common iliac arteries, small hypogastric arteries, small external iliac arteries. No flow-limiting stenosis appreciated within arterial inflow.   Right leg: Normal common femoral artery, normal profunda, proximal portion of the superficial femoral artery was normal.  The distal superficial femoral artery demonstrated a 1 cm flow-limiting lesion.  This was soft, and likely the nidus for her blue toe syndrome.  Distally, the popliteal artery was normal.  Anterior tibial artery was normal with sluggish flow.  There is a lesion appreciated in the dorsalis pedis artery.  The peroneal and posterior tibial arteries were normal with brisk filling of the foot.   Left leg: Short common femoral artery, with high femoral bifurcation.  Normal profunda, normal proximal superficial femoral artery  DATA:     ABI Findings:  +---------+------------------+-----+---------+--------+   Right     Rt Pressure (mmHg) Index Waveform  Comment    +---------+------------------+-----+---------+--------+   Brachial  141                                           +---------+------------------+-----+---------+--------+   PTA       162                1.15  triphasic            +---------+------------------+-----+---------+--------+   DP        143                1.01  triphasic            +---------+------------------+-----+---------+--------+   Great Toe 126                0.89  Normal               +---------+------------------+-----+---------+--------+   +---------+------------------+-----+---------+-------+   Left      Lt Pressure (mmHg) Index Waveform  Comment   +---------+------------------+-----+---------+-------+   Brachial  141                                          +---------+------------------+-----+---------+-------+   PTA       157                 1.11  triphasic           +---------+------------------+-----+---------+-------+   DP        160                1.13  triphasic           +---------+------------------+-----+---------+-------+   Great Toe 125  0.89  Normal              +---------+------------------+-----+---------+-------+   +-------+-----------+-----------+------------+------------+   ABI/TBI Today's ABI Today's TBI Previous ABI Previous TBI   +-------+-----------+-----------+------------+------------+   Right   1.15        0.89                                    +-------+-----------+-----------+------------+------------+   Left    1.13        0.89                                    +-------+-----------+-----------+------------+------------+      Summary:  Right: Resting right ankle-brachial index is within normal range. No  evidence of significant right lower extremity arterial disease. The right  toe-brachial index is normal.   Left: Resting left ankle-brachial index is within normal range. No  evidence of significant left lower extremity arterial disease. The left  toe-brachial index is normal.     +----------+--------+-----+--------+---------+--------+   RIGHT      PSV cm/s Ratio Stenosis Waveform  Comments   +----------+--------+-----+--------+---------+--------+   DFA        78                      triphasic            +----------+--------+-----+--------+---------+--------+   SFA Prox   99                      triphasic            +----------+--------+-----+--------+---------+--------+   SFA Mid    137                     triphasic            +----------+--------+-----+--------+---------+--------+   POP Distal 66                      triphasic            +----------+--------+-----+--------+---------+--------+        Right Stent(s):  +---------------+--------+--------+---------+--------+   distal SFA      PSV cm/s Stenosis Waveform  Comments    +---------------+--------+--------+---------+--------+   Prox to Stent   133               triphasic            +---------------+--------+--------+---------+--------+   Proximal Stent  114               triphasic            +---------------+--------+--------+---------+--------+   Mid Stent       103               triphasic            +---------------+--------+--------+---------+--------+   Distal Stent    89                triphasic            +---------------+--------+--------+---------+--------+   Distal to Stent 112               triphasic            +---------------+--------+--------+---------+--------+    Summary:  Right: Widely patent distal superficial  femoral artery stent without  evidence of stenosis.   ASSESSMENT/PLAN: PAD with symptoms of claudication and bluish toe discoloration s/p angiogram of the right LE with Superficial femoral artery stenting - 5 x 40 mm Innova and balloon angioplasty.    She has excellent inflow post intervention.  Cont. ASA, Plavix and Statin daily for maximum medical therapy.   She will continue on dual antiplatelet therapy lifelong.  She is in the process of finding a PCP.  I will have her return in 6 months for repeat duplex and ABI's.       Mosetta Pigeon PA-C [facility] [place] Office: [phone]  MD in clinic [place]

## 2021-08-01 ENCOUNTER — Other Ambulatory Visit: Payer: Self-pay

## 2021-08-01 DIAGNOSIS — I739 Peripheral vascular disease, unspecified: Secondary | ICD-10-CM

## 2021-08-01 NOTE — Progress Notes (Signed)
error 

## 2022-08-07 ENCOUNTER — Telehealth: Payer: Self-pay | Admitting: Physician Assistant

## 2022-08-07 NOTE — Telephone Encounter (Signed)
-----   Message from Tiffany Forbes, [place] sent at 08/03/2022  8:52 AM EST -----  It looks like this patient was lost to f/u can we get her a f/u visit with PA needs right LE arterial duplex and ABI's thanks next 4 weeks would be fine

## 2022-09-08 ENCOUNTER — Encounter: Payer: Self-pay | Admitting: Physician Assistant

## 2022-10-01 ENCOUNTER — Other Ambulatory Visit: Payer: Self-pay | Admitting: *Deleted

## 2022-10-01 DIAGNOSIS — I739 Peripheral vascular disease, unspecified: Secondary | ICD-10-CM

## 2022-10-12 ENCOUNTER — Encounter: Payer: Self-pay | Admitting: Family Medicine

## 2022-10-12 ENCOUNTER — Ambulatory Visit: Payer: 59 | Admitting: Physician Assistant

## 2022-10-12 ENCOUNTER — Ambulatory Visit (INDEPENDENT_AMBULATORY_CARE_PROVIDER_SITE_OTHER)
Admission: RE | Admit: 2022-10-12 | Discharge: 2022-10-12 | Disposition: A | Discharge status: Home / Self Care | Payer: 59 | Source: Ambulatory Visit | Attending: Vascular Surgery | Admitting: Vascular Surgery

## 2022-10-12 ENCOUNTER — Ambulatory Visit (INDEPENDENT_AMBULATORY_CARE_PROVIDER_SITE_OTHER): Payer: 59 | Admitting: Family Medicine

## 2022-10-12 ENCOUNTER — Ambulatory Visit (HOSPITAL_COMMUNITY)
Admission: RE | Admit: 2022-10-12 | Discharge: 2022-10-12 | Disposition: A | Discharge status: Home / Self Care | Payer: 59 | Source: Ambulatory Visit | Attending: Vascular Surgery | Admitting: Vascular Surgery

## 2022-10-12 VITALS — BP 131/78 | HR 71 | Temp 97.8°F | Resp 20 | Ht 66.0 in | Wt 195.1 lb

## 2022-10-12 VITALS — BP 128/74 | HR 74 | Temp 98.1°F | Resp 18 | Ht 66.0 in | Wt 195.6 lb

## 2022-10-12 DIAGNOSIS — Z114 Encounter for screening for human immunodeficiency virus [HIV]: Secondary | ICD-10-CM

## 2022-10-12 DIAGNOSIS — I739 Peripheral vascular disease, unspecified: Secondary | ICD-10-CM | POA: Insufficient documentation

## 2022-10-12 DIAGNOSIS — E669 Obesity, unspecified: Secondary | ICD-10-CM | POA: Diagnosis not present

## 2022-10-12 DIAGNOSIS — E66811 Obesity, class 1: Secondary | ICD-10-CM | POA: Insufficient documentation

## 2022-10-12 DIAGNOSIS — F419 Anxiety disorder, unspecified: Secondary | ICD-10-CM | POA: Diagnosis not present

## 2022-10-12 DIAGNOSIS — Z6831 Body mass index (BMI) 31.0-31.9, adult: Secondary | ICD-10-CM

## 2022-10-12 DIAGNOSIS — Z86718 Personal history of other venous thrombosis and embolism: Secondary | ICD-10-CM | POA: Insufficient documentation

## 2022-10-12 DIAGNOSIS — Z1159 Encounter for screening for other viral diseases: Secondary | ICD-10-CM

## 2022-10-12 DIAGNOSIS — Z1231 Encounter for screening mammogram for malignant neoplasm of breast: Secondary | ICD-10-CM

## 2022-10-12 LAB — CBC WITH DIFFERENTIAL/PLATELET
Basophils Absolute: 0.1 10*3/uL (ref 0.0–0.1)
Basophils Relative: 0.8 % (ref 0.0–3.0)
Eosinophils Absolute: 0.1 10*3/uL (ref 0.0–0.7)
Eosinophils Relative: 1 % (ref 0.0–5.0)
HCT: 39.9 % (ref 36.0–46.0)
Hemoglobin: 13.5 g/dL (ref 12.0–15.0)
Lymphocytes Relative: 33.9 % (ref 12.0–46.0)
Lymphs Abs: 3.2 10*3/uL (ref 0.7–4.0)
MCHC: 33.8 g/dL (ref 30.0–36.0)
MCV: 97.6 fl (ref 78.0–100.0)
Monocytes Absolute: 0.6 10*3/uL (ref 0.1–1.0)
Monocytes Relative: 6.7 % (ref 3.0–12.0)
Neutro Abs: 5.4 10*3/uL (ref 1.4–7.7)
Neutrophils Relative %: 57.6 % (ref 43.0–77.0)
Platelets: 310 10*3/uL (ref 150.0–400.0)
RBC: 4.09 Mil/uL (ref 3.87–5.11)
RDW: 12.5 % (ref 11.5–15.5)
WBC: 9.3 10*3/uL (ref 4.0–10.5)

## 2022-10-12 LAB — COMPREHENSIVE METABOLIC PANEL
ALT: 23 U/L (ref 0–35)
AST: 19 U/L (ref 0–37)
Albumin: 4.3 g/dL (ref 3.5–5.2)
Alkaline Phosphatase: 89 U/L (ref 39–117)
BUN: 14 mg/dL (ref 6–23)
CO2: 29 mEq/L (ref 19–32)
Calcium: 9.8 mg/dL (ref 8.4–10.5)
Chloride: 102 mEq/L (ref 96–112)
Creatinine, Ser: 0.74 mg/dL (ref 0.40–1.20)
GFR: 89.91 mL/min (ref >60.00)
Glucose, Bld: 111 mg/dL — ABNORMAL HIGH (ref 70–99)
Potassium: 4.2 mEq/L (ref 3.5–5.1)
Sodium: 139 mEq/L (ref 135–145)
Total Bilirubin: 0.5 mg/dL (ref 0.2–1.2)
Total Protein: 7 g/dL (ref 6.0–8.3)

## 2022-10-12 LAB — LIPID PANEL
Cholesterol: 151 mg/dL (ref 0–200)
HDL: 38.5 mg/dL — ABNORMAL LOW (ref >39.00)
LDL Cholesterol: 97 mg/dL (ref 0–99)
NonHDL: 112.18
Total CHOL/HDL Ratio: 4
Triglycerides: 78 mg/dL (ref 0.0–149.0)
VLDL: 15.6 mg/dL (ref 0.0–40.0)

## 2022-10-12 LAB — HEMOGLOBIN A1C: Hgb A1c MFr Bld: 6.9 % — ABNORMAL HIGH (ref 4.6–6.5)

## 2022-10-12 LAB — VAS US ABI WITH/WO TBI
Left ABI: 1.15
Right ABI: 1.03

## 2022-10-12 LAB — TSH: TSH: 1.39 u[IU]/mL (ref 0.35–5.50)

## 2022-10-12 MED ORDER — ATORVASTATIN CALCIUM 20 MG PO TABS: 20.0000 mg | ORAL_TABLET | Freq: Every day | ORAL | 11 refills | Status: DC

## 2022-10-12 MED ORDER — HYDROXYZINE HCL 25 MG PO TABS: 25.0000 mg | ORAL_TABLET | Freq: Three times a day (TID) | ORAL | 0 refills | Status: AC | PRN

## 2022-10-12 NOTE — Assessment & Plan Note (Signed)
Significant anxiety with some borderline depression as well. Discussed options including counseling, SSRI/SNRI, lifestyle measures, etc. Patient states she would like to start with counseling and as needed meds instead of daily medications right away. We will try hydroxyzine - medication discussed and information sheet provided. Referral to [facility] placed. No SI/HI

## 2022-10-12 NOTE — Assessment & Plan Note (Signed)
Currently doing well overall.  Encouraged to continue avoiding tobacco, encouraged heart healthy diet and regular exercise.  Discussed significance of continuing Lipitor, Aspirin, Plavix Reports she has had some mild calf soreness bilaterally lately, but nothing like she was experiencing initially - she is following up with vascular later today for repeat imaging

## 2022-10-12 NOTE — Patient Instructions (Signed)
Adding as needed hydroxyzine for anxiety. This could make you drowsy. Review attached handout.  Referral for counseling. If you decide you would like to try a daily anxiety/depression medication we can do that as well.  Labs today Mammogram ordered - they will be calling you to schedule  ------------------------------  Thank you for choosing [facility] at [facility] for your Primary Care needs. I am excited for the opportunity to partner with you to meet your health care goals. It was a pleasure meeting you today!  Information on diet, exercise, and health maintenance recommendations are listed below. This is information to help you be sure you are on track for optimal health and monitoring.   Please look over this and let us know if you have any questions or if you have completed any of the health maintenance outside of [place] so that we can be sure your records are up to date.  ___________________________________________________________  MyChart:  For all urgent or time sensitive needs we ask that you please call the office to avoid delays. Our number is [phone]. MyChart is not constantly monitored and due to the large volume of messages a day, replies may take up to 72 business hours.  MyChart Policy: MyChart allows for you to see your visit notes, after visit summary, provider recommendations, lab and tests results, make an appointment, request refills, and contact your provider or the office for non-urgent questions or concerns. Providers are seeing patients during normal business hours and do not have built in time to review MyChart messages.  We ask that you allow a minimum of 3 business days for responses to [employer]. For this reason, please do not send urgent requests through [place]. Please call the office at [phone]. New and ongoing conditions may require a visit. We have virtual and in-person visits available for your convenience.   Complex MyChart concerns may require a visit. Your provider may request you schedule a virtual or in-person visit to ensure we are providing the best care possible. MyChart messages sent after 11:00 AM on Friday will not be received by the provider until Monday morning.    Lab and Test Results: You will receive your lab and test results on MyChart as soon as they are completed and results have been sent by the lab or testing facility. Due to this service, you will receive your results BEFORE your provider.  I review lab and test results each morning prior to seeing patients. Some results require collaboration with other providers to ensure you are receiving the most appropriate care. For this reason, we ask that you please allow a minimum of 3-5 business days from the time that ALL results have been received for your provider to receive and review lab and test results and contact you about these.  Most lab and test result comments from the provider will be sent through [place]. Your provider may recommend changes to the plan of care, follow-up visits, repeat testing, ask questions, or request an office visit to discuss these results. You may reply directly to this message or call the office to provide information for the provider or set up an appointment. In some instances, you will be called with test results and recommendations. Please let us know if this is preferred and we will make note of this in your chart to provide this for you.    If you have not heard a response to your lab or test results in 5 business days  from all results returning to [place], please call the office to let us know. We ask that you please avoid calling prior to this time unless there is an emergent concern. Due to high call volumes, this can delay the resulting process.  After Hours: For all non-emergency after hours needs, please call the office at [phone] and select the option to reach the on-call  service. On-call  services are shared between multiple [facility] offices and therefore it will not be possible to speak directly with your provider. On-call providers may provide medical advice and recommendations, but are unable to provide refills for maintenance medications.  For all emergency or urgent medical needs after normal business hours, we recommend that you seek care at the closest Urgent Care or Emergency Department to ensure appropriate treatment in a timely manner.  [facility] has a 24 hour emergency room located on the ground floor for your convenience.   Urgent Concerns During the Business Day Providers are seeing patients from 8AM to [place] with a busy schedule and are most often not able to respond to non-urgent calls until the end of the day or the next business day. If you should have URGENT concerns during the day, please call and speak to the nurse or schedule a same day appointment so that we can address your concern without delay.   Thank you, again, for choosing me as your health care partner. I appreciate your trust and look forward to learning more about you!   [employer] [place] Olevia Bowens, DNP, FNP-C  ___________________________________________________________  Health Maintenance Recommendations Screening Testing Mammogram Every 1-2 years based on history and risk factors Starting at age 5 Pap Smear Ages 21-39 every 3 years Ages 66-65 every 5 years with HPV testing More frequent testing may be required based on results and history Colon Cancer Screening Every 1-10 years based on test performed, risk factors, and history Starting at age 21 Bone Density Screening Every 2-10 years based on history Starting at age 90 for women Recommendations for men differ based on medication usage, history, and risk factors AAA Screening One time ultrasound Men 25-46 years old who have ever smoked Lung Cancer Screening Low Dose Lung CT every 12 months Age 75-80 years with a 20 pack-year  smoking history who still smoke or who have quit within the last 15 years  Screening Labs Routine  Labs: Complete Blood Count (CBC), Complete Metabolic Panel (CMP), Cholesterol (Lipid Panel) Every 6-12 months based on history and medications May be recommended more frequently based on current conditions or previous results Hemoglobin A1c Lab Every 3-12 months based on history and previous results Starting at age 41 or earlier with diagnosis of diabetes, high cholesterol, BMI >26, and/or risk factors Frequent monitoring for patients with diabetes to ensure blood sugar control Thyroid Panel  Every 6 months based on history, symptoms, and risk factors May be repeated more often if on medication HIV One time testing for all patients 56 and older May be repeated more frequently for patients with increased risk factors or exposure Hepatitis C One time testing for all patients 53 and older May be repeated more frequently for patients with increased risk factors or exposure Gonorrhea, Chlamydia Every 12 months for all sexually active persons 13-24 years Additional monitoring may be recommended for those who are considered high risk or who have symptoms PSA Men 36-80 years old with risk factors Additional screening may be recommended from age 51-69 based on risk factors, symptoms, and history  Vaccine  Recommendations Tetanus Booster All adults every 10 years Flu Vaccine All patients 6 months and older every year COVID Vaccine All patients 12 years and older Initial dosing with booster May recommend additional booster based on age and health history HPV Vaccine 2 doses all patients age 58-26 Dosing may be considered for patients over 26 Shingles Vaccine (Shingrix) 2 doses all adults 42 years and older Pneumonia (Pneumovax 23) All adults 53 years and older May recommend earlier dosing based on health history Pneumonia (Prevnar 34) All adults 73 years and older Dosed 1 year after  Pneumovax 23 Pneumonia (Prevnar 43) All adults 68 years and older (adults [id number] with certain conditions or risk factors) 1 dose  For those who have not received Prevnar 13 vaccine previously   Additional Screening, Testing, and Vaccinations may be recommended on an individualized basis based on family history, health history, risk factors, and/or exposure.  __________________________________________________________  Diet Recommendations for All Patients  I recommend that all patients maintain a diet low in saturated fats, carbohydrates, and cholesterol. While this can be challenging at first, it is not impossible and small changes can make big differences.  Things to try: Decreasing the amount of soda, sweet tea, and/or juice to one or less per day and replace with water While water is always the first choice, if you do not like water you may consider adding a water additive without sugar to improve the taste other sugar free drinks Replace potatoes with a brightly colored vegetable  Use healthy oils, such as canola oil or olive oil, instead of butter or hard margarine Limit your bread intake to two pieces or less a day Replace regular pasta with low carb pasta options Bake, broil, or grill foods instead of frying Monitor portion sizes  Eat smaller, more frequent meals throughout the day instead of large meals  An important thing to remember is, if you love foods that are not great for your health, you don't have to give them up completely. Instead, allow these foods to be a reward when you have done well. Allowing yourself to still have special treats every once in a while is a nice way to tell yourself thank you for working hard to keep yourself healthy.   Also remember that every day is a new day. If you have a bad day and "fall off the wagon", you can still climb right back up and keep moving along on your journey!  We have resources available to help you!  Some websites that may  be helpful include: www.http://carter.biz/  Www.[employer]Well[employer].com _____________________________________________________________  Activity Recommendations for All Patients  I recommend that all adults get at least 20 minutes of moderate physical activity that elevates your heart rate at least 5 days out of the week.  Some examples include: Walking or jogging at a pace that allows you to carry on a conversation Cycling (stationary bike or outdoors) Water aerobics Yoga Weight lifting Dancing If physical limitations prevent you from putting stress on your joints, exercise in a pool or seated in a chair are excellent options.  Do determine your MAXIMUM heart rate for activity: 220 - YOUR AGE = MAX Heart Rate   Remember! Do not push yourself too hard.  Start slowly and build up your pace, speed, weight, time in exercise, etc.  Allow your body to rest between exercise and get good sleep. You will need more water than normal when you are exerting yourself. Do not wait until you are thirsty to drink. Drink  with a purpose of getting in at least 8, 8 ounce glasses of water a day plus more depending on how much you exercise and sweat.    If you begin to develop dizziness, chest pain, abdominal pain, jaw pain, shortness of breath, headache, vision changes, lightheadedness, or other concerning symptoms, stop the activity and allow your body to rest. If your symptoms are severe, seek emergency evaluation immediately. If your symptoms are concerning, but not severe, please let us know so that we can recommend further evaluation.

## 2022-10-12 NOTE — Progress Notes (Signed)
New Patient Office Visit  Subjective    Patient ID: Tiffany Forbes, female    DOB: 06-28-1966  Age: 57 y.o. MRN: [id number]  CC:  Chief Complaint  Patient presents with   New Patient (Initial Visit)    Transferring from: not since 2015 Concerns/ questions: Pt would like to have her cholesterol levels checked d/t hx of DVT.  Shingrix: none HIV/ Hep C screen due Tdap- none in the last 10 years Pap/ mammogram no recent paps, last Mamm was 2022 Colon: never Flu shot today: declines     HPI Tiffany Forbes presents to establish care. She has not had a recent PCP.    PAD: In November 2022 patient went to the ED with claudication symptoms of right lower extremity.  She had an angiogram with right superficial femoral artery stenting and balloon angioplasty. At that time she stopped smoking cold-turkey (previously smoking 1.5 packs/day for 30+ years - states she has not restarted since quitting!). She follows with vascular surgery and was started on Lipitor, Aspirin, and Plavix - they recommended lifelong dual antiplatelet therapy. Her next follow-up with them is later today.   Anxiety/Depression: Patient reports that since quitting smoking, she has noticed more depression/anxiety that she was previously using smoking to cope with. Additionally, she has had some personal life stressors over the past year or so and is currently separated from her husband (married 25+ years) and staying at her brother's house right now. She also just started a new job.   Obesity: She reports that since hitting menopause several years ago and then stopping smoking, she has been gaining weight. She admits that she eats more than she should and has not been exercising regularly, though she tries to stay active in general.        10/12/2022    9:01 AM  PHQ9 SCORE ONLY  PHQ-9 Total Score 6      10/12/2022    9:01 AM  GAD 7 : Generalized Anxiety Score  Nervous, Anxious, on Edge 3  Control/stop  worrying 0  Worry too much - different things 3  Trouble relaxing 3  Restless 0  Easily annoyed or irritable 3  Afraid - awful might happen 0  Total GAD 7 Score 12  Anxiety Difficulty Not difficult at all     Health Maintenance  Topic Date Due   COVID-19 Vaccine (1) Never done - declined today    Hepatitis C Screening  Never done - ordered today   DTaP/Tdap/Td (1 - Tdap) Never done - declined today   PAP SMEAR-Modifier  Never done - will schedule CPE/pap   COLONOSCOPY (Pts 45-3yrs Insurance coverage will need to be confirmed)  Never done - declined today   Zoster Vaccines- Shingrix (1 of 2) Never done - declined today   INFLUENZA VACCINE  Never done - declined today    MAMMOGRAM  10/14/2022 - ordered today    HIV Screening  Completed - ordered today    HPV VACCINES  Aged Out - n/a      Outpatient Encounter Medications as of 10/12/2022  Medication Sig   aspirin EC 81 MG tablet Take 81 mg by mouth daily. Swallow whole.   clopidogrel (PLAVIX) 75 MG tablet Take 75 mg by mouth daily.   hydrOXYzine (ATARAX) 25 MG tablet Take 1 tablet (25 mg total) by mouth 3 (three) times daily as needed.   atorvastatin (LIPITOR) 80 MG tablet Take 1 tablet (80 mg total) by mouth daily.  No facility-administered encounter medications on file as of 10/12/2022.    History reviewed. No pertinent past medical history.   Past Surgical History:  Procedure Laterality Date   ABDOMINAL AORTOGRAM W/LOWER EXTREMITY N/A 06/28/2021   Procedure: ABDOMINAL AORTOGRAM W/LOWER EXTREMITY;  Surgeon: Broadus John, MD;  Location: [facility];  Service: Cardiovascular;  Laterality: N/A;   PERIPHERAL VASCULAR INTERVENTION Right 06/28/2021   Procedure: PERIPHERAL VASCULAR INTERVENTION;  Surgeon: Broadus John, MD;  Location: [facility];  Service: Cardiovascular;  Laterality: Right;    History reviewed. No pertinent family history.  Social History   Socioeconomic History   Marital status:  Legally Separated    Spouse name: Not on file   Number of children: Not on file   Years of education: Not on file   Highest education level: Not on file  Occupational History   Not on file  Tobacco Use   Smoking status: Former    Types: Cigarettes    Quit date: 06/28/2021    Years since quitting: 1.2   Smokeless tobacco: Never  Vaping Use   Vaping Use: Never used  Substance and Sexual Activity   Alcohol use: Never   Drug use: Never   Sexual activity: Not on file  Other Topics Concern   Not on file  Social History Narrative   Not on file   Social Determinants of Health   Financial Resource Strain: Not on file  Food Insecurity: Not on file  Transportation Needs: Not on file  Physical Activity: Not on file  Stress: Not on file  Social Connections: Not on file  Intimate Partner Violence: Not on file    ROS All review of systems negative except what is listed in the HPI      Objective    BP 128/74   Pulse 74   Temp 98.1 F (36.7 C) (Oral)   Resp 18   Ht 5\' 6"  (1.676 m)   Wt 195 lb 9.6 oz (88.7 kg)   SpO2 99%   BMI 31.57 kg/m   Physical Exam Vitals reviewed.  Constitutional:      Appearance: Normal appearance. She is obese.  Cardiovascular:     Rate and Rhythm: Normal rate and regular rhythm.     Pulses: Normal pulses.     Heart sounds: Normal heart sounds.  Pulmonary:     Effort: Pulmonary effort is normal.     Breath sounds: Normal breath sounds.  Skin:    General: Skin is warm and dry.  Neurological:     Mental Status: She is alert and oriented to person, place, and time.  Psychiatric:        Mood and Affect: Mood normal.        Behavior: Behavior normal.        Thought Content: Thought content normal.        Judgment: Judgment normal.         Assessment & Plan:   Problem List Items Addressed This Visit       Cardiovascular and Mediastinum   PAD (peripheral artery disease) ([place])    Currently doing well overall.  Encouraged to  continue avoiding tobacco, encouraged heart healthy diet and regular exercise.  Discussed significance of continuing Lipitor, Aspirin, Plavix Reports she has had some mild calf soreness bilaterally lately, but nothing like she was experiencing initially - she is following up with vascular later today for repeat imaging         Other  Anxiety - Primary    Significant anxiety with some borderline depression as well. Discussed options including counseling, SSRI/SNRI, lifestyle measures, etc. Patient states she would like to start with counseling and as needed meds instead of daily medications right away. We will try hydroxyzine - medication discussed and information sheet provided. Referral to [facility] placed. No SI/HI      Relevant Medications   hydrOXYzine (ATARAX) 25 MG tablet   Class 1 obesity with body mass index (BMI) of 31.0 to 31.9 in adult    Discussed lifestyle measures Labs today       Relevant Orders   CBC with Differential/Platelet   Comprehensive metabolic panel   Lipid panel   TSH   Hemoglobin A1c   Other Visit Diagnoses     Encounter for screening for HIV       Relevant Orders   HIV Antibody (routine testing w rflx)   Encounter for hepatitis C screening test for low risk patient       Relevant Orders   Hepatitis C antibody   Encounter for screening mammogram for malignant neoplasm of breast       Relevant Orders   MM DIGITAL SCREENING BILATERAL       Return for physical/pap at your convenience .   Terrilyn Saver, NP

## 2022-10-12 NOTE — Assessment & Plan Note (Signed)
Discussed lifestyle measures Labs today

## 2022-10-12 NOTE — Progress Notes (Signed)
Office Note     CC:  follow up Requesting Provider:  Terrilyn Saver, NP  HPI: Tiffany Forbes is a 57 y.o. (05-08-1966) female who presents for follow up of PAD. She has history of right SFA stenting by Dr. Virl Cagey in November of 2022. This was for claudication and some ischemic changes to her right toes. Following the procedure she had resolution of her symptoms. At her last visit in December of 2022 she was walking for exercise a good distance and only having some neuropathy like symptoms in her right foot with burning and tingling. Otherwise she was compliant with her Aspirin, Statin and Plavix. Her non invasive studies showed patient SFA stent with normal ABI's.  Today she returns with non invasive studies. She reports overall she has been doing well. She did have episode last week when she was walking where she had cramping in both her legs and also felt short of breath on ambulation of 1/2 mile. She says she normally walks several miles a day and does not have any issues so this was out of the ordinary. With rest it resolved and she has had no recurrence. She denies any pain on rest and no tissue loss. She tries to remain active. She is compliant with her statin, aspirin and Plavix. She does explain that she has been having a lot of muscle and joint pain since taking the Atorvastatin and asks if she can decrease her dosing today.   The pt is on a statin for cholesterol management.  The pt is on a daily aspirin.   Other AC:  Plavix The pt is not on medication for hypertension.   The pt is not diabetic Tobacco hx:  former, quit November 2022  No past medical history on file.  Past Surgical History:  Procedure Laterality Date   ABDOMINAL AORTOGRAM W/LOWER EXTREMITY N/A 06/28/2021   Procedure: ABDOMINAL AORTOGRAM W/LOWER EXTREMITY;  Surgeon: Broadus John, MD;  Location: [facility];  Service: Cardiovascular;  Laterality: N/A;   PERIPHERAL VASCULAR INTERVENTION Right 06/28/2021    Procedure: PERIPHERAL VASCULAR INTERVENTION;  Surgeon: Broadus John, MD;  Location: [facility];  Service: Cardiovascular;  Laterality: Right;    Social History   Socioeconomic History   Marital status: Legally Separated    Spouse name: Not on file   Number of children: Not on file   Years of education: Not on file   Highest education level: Not on file  Occupational History   Not on file  Tobacco Use   Smoking status: Former    Types: Cigarettes    Quit date: 06/28/2021    Years since quitting: 1.2    Passive exposure: Never   Smokeless tobacco: Never  Vaping Use   Vaping Use: Never used  Substance and Sexual Activity   Alcohol use: Never   Drug use: Never   Sexual activity: Not on file  Other Topics Concern   Not on file  Social History Narrative   Not on file   Social Determinants of Health   Financial Resource Strain: Not on file  Food Insecurity: Not on file  Transportation Needs: Not on file  Physical Activity: Not on file  Stress: Not on file  Social Connections: Not on file  Intimate Partner Violence: Not on file   No family history on file.  Current Outpatient Medications  Medication Sig Dispense Refill   aspirin EC 81 MG tablet Take 81 mg by mouth daily. Swallow whole.  atorvastatin (LIPITOR) 20 MG tablet Take 1 tablet (20 mg total) by mouth daily. 30 tablet 11   clopidogrel (PLAVIX) 75 MG tablet Take 75 mg by mouth daily.     hydrOXYzine (ATARAX) 25 MG tablet Take 1 tablet (25 mg total) by mouth 3 (three) times daily as needed. (Patient not taking: Reported on 10/12/2022) 30 tablet 0   No current facility-administered medications for this visit.    No Known Allergies   REVIEW OF SYSTEMS:   [X]  denotes positive finding, [ ]  denotes negative finding Cardiac  Comments:  Chest pain or chest pressure:    Shortness of breath upon exertion:    Short of breath when lying flat:    Irregular heart rhythm:        Vascular    Pain in  calf, thigh, or hip brought on by ambulation:    Pain in feet at night that wakes you up from your sleep:     Blood clot in your veins:    Leg swelling:         Pulmonary    Oxygen at home:    Productive cough:     Wheezing:         Neurologic    Sudden weakness in arms or legs:     Sudden numbness in arms or legs:     Sudden onset of difficulty speaking or slurred speech:    Temporary loss of vision in one eye:     Problems with dizziness:         Gastrointestinal    Blood in stool:     Vomited blood:         Genitourinary    Burning when urinating:     Blood in urine:        Psychiatric    Major depression:         Hematologic    Bleeding problems:    Problems with blood clotting too easily:        Skin    Rashes or ulcers:        Constitutional    Fever or chills:      PHYSICAL EXAMINATION:  Vitals:   10/12/22 1318  BP: 131/78  Pulse: 71  Resp: 20  Temp: 97.8 F (36.6 C)  TempSrc: Temporal  SpO2: 97%  Weight: 195 lb 1.6 oz (88.5 kg)  Height: 5\' 6"  (1.676 m)    General:  WDWN in NAD; vital signs documented above Gait: Normal HENT: WNL, normocephalic Pulmonary: normal non-labored breathing , without  wheezing Cardiac: regular HR, without  Murmurs without carotid bruit Abdomen: soft, ND Vascular Exam/Pulses:  Right Left  Radial 2+ (normal) 2+ (normal)  Femoral 2+ (normal) 2+ (normal)  Popliteal 2+ (normal) 2+ (normal)  DP 2+ (normal) 2+ (normal)  PT 2+ (normal) 2+ (normal)   Extremities: without ischemic changes, without Gangrene , without cellulitis; without open wounds;  Musculoskeletal: no muscle wasting or atrophy  Neurologic: A&O X 3;  No focal weakness or paresthesias are detected Psychiatric:  The pt has Normal affect.   Non-Invasive Vascular Imaging:   +-------+-----------+-----------+------------+------------+  ABI/TBIToday's ABIToday's TBIPrevious ABIPrevious TBI  +-------+-----------+-----------+------------+------------+   Right 1.03       0.98       1.15        0.89          +-------+-----------+-----------+------------+------------+  Left  1.15       0.81       1.15  0.89          +-------+-----------+-----------+------------+------------+   +-----------+--------+-----+--------+---------+--------+  RIGHT     PSV cm/sRatioStenosisWaveform Comments  +-----------+--------+-----+--------+---------+--------+  CFA Distal 136                  biphasic           +-----------+--------+-----+--------+---------+--------+  DFA       73                   biphasic           +-----------+--------+-----+--------+---------+--------+  SFA Prox   103                  triphasic          +-----------+--------+-----+--------+---------+--------+  SFA Mid    114                  triphasic          +-----------+--------+-----+--------+---------+--------+  POP Prox   69                   triphasic          +-----------+--------+-----+--------+---------+--------+  POP Distal 58                   triphasic          +-----------+--------+-----+--------+---------+--------+  ATA Prox   74                   triphasic          +-----------+--------+-----+--------+---------+--------+  ATA Mid    80                   triphasic          +-----------+--------+-----+--------+---------+--------+  ATA Distal 100                  triphasic          +-----------+--------+-----+--------+---------+--------+  PTA Prox   51                   biphasic           +-----------+--------+-----+--------+---------+--------+  PTA Mid    61                   biphasic           +-----------+--------+-----+--------+---------+--------+  PTA Distal 72                   triphasic          +-----------+--------+-----+--------+---------+--------+  PERO Prox  52                   biphasic            +-----------+--------+-----+--------+---------+--------+  PERO Mid   66                   triphasic          +-----------+--------+-----+--------+---------+--------+  PERO Distal42                   triphasic          +-----------+--------+-----+--------+---------+--------+     Right Stent(s):  +---------------+--------+--------+---------+--------+  SFA           PSV cm/sStenosisWaveform Comments  +---------------+--------+--------+---------+--------+  Prox to Stent  97              triphasic          +---------------+--------+--------+---------+--------+  Proximal Stent 129             triphasic          +---------------+--------+--------+---------+--------+  Mid Stent      120             triphasic          +---------------+--------+--------+---------+--------+  Distal Stent   159             triphasic          +---------------+--------+--------+---------+--------+  Distal to Stent134             triphasic          +---------------+--------+--------+---------+--------+   Summary:  Right: Patent stent with no evidence of stenosis in the superficial femoral artery artery. Widely patent right lower extremity arterial tree with three vessel runoff    ASSESSMENT/PLAN:: 57 y.o. female here for follow up for PAD. She is without any claudication, rest pain or tissue loss. Her ABIs today are normal. Her duplex shows patent RLE with triphasic three vessel runoff and patent right SFA stent.  -She is okay to discontinue the Plavix - Recommend she continue Aspirin and statin. I will send new prescription for Lipitor 20 mg daily since she is having some intolerance at the higher dose - encouraged continued walking regimen and congratulated her on her continued smoking cessation - She will follow up in 1 year with repeat RLE arterial duplex and ABIs - She knows to call for earlier follow up    Karoline Caldwell, PA-C [facility] [phone]  Clinic MD:   Virl Cagey

## 2022-10-16 ENCOUNTER — Encounter: Payer: Self-pay | Admitting: Family Medicine

## 2022-10-16 ENCOUNTER — Telehealth: Payer: Self-pay | Admitting: Family Medicine

## 2022-10-16 NOTE — Telephone Encounter (Signed)
Results given to patient by Runell Gess.

## 2022-10-16 NOTE — Telephone Encounter (Signed)
Pt returning call about lab results 

## 2022-10-17 LAB — HCV RNA,QUANTITATIVE REAL TIME PCR
HCV Quantitative Log: <1.18 Log IU/mL
HCV RNA, PCR, QN: <15 IU/mL

## 2022-10-17 LAB — HEPATITIS C ANTIBODY: Hepatitis C Ab: BORDERLINE — AB

## 2022-10-17 LAB — HIV ANTIBODY (ROUTINE TESTING W REFLEX): HIV 1&2 Ab, 4th Generation: NONREACTIVE

## 2022-11-20 ENCOUNTER — Inpatient Hospital Stay (HOSPITAL_BASED_OUTPATIENT_CLINIC_OR_DEPARTMENT_OTHER): Admission: RE | Admit: 2022-11-20 | Payer: 59 | Source: Ambulatory Visit

## 2023-01-16 ENCOUNTER — Encounter: Payer: 59 | Admitting: Family Medicine

## 2023-01-16 DIAGNOSIS — Z0000 Encounter for general adult medical examination without abnormal findings: Secondary | ICD-10-CM

## 2023-01-18 IMAGING — US US EXTREM LOW VENOUS*R*
1 series · 13 of 24 positions shown · non-contrast
Comparison: None.

CLINICAL DATA: Lower extremity discoloration



[Series 1: us extrem low venous*right* · 13 of 42 slices shown]
[im 1/42]
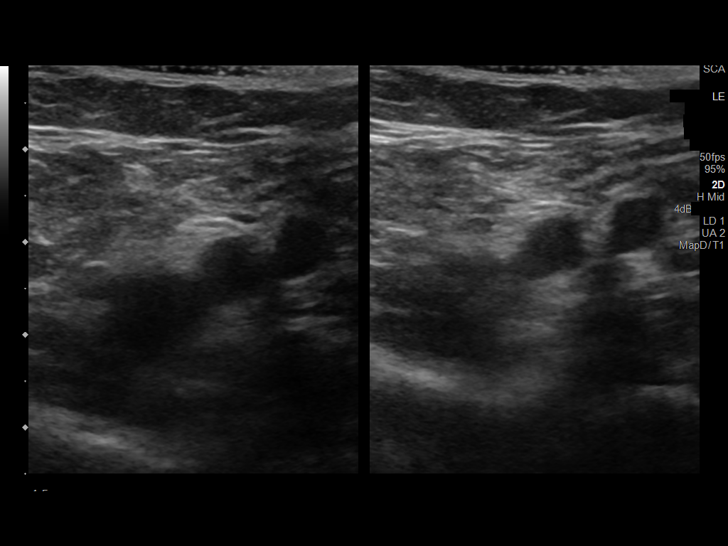
[im 4/42]
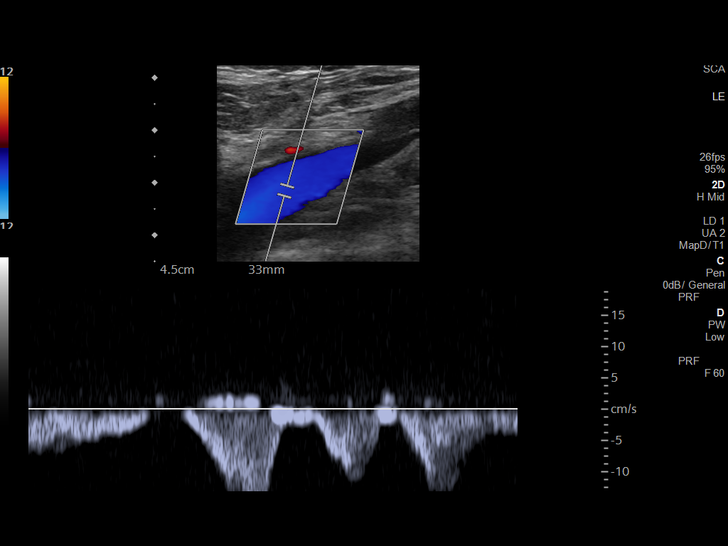
[im 8/42]
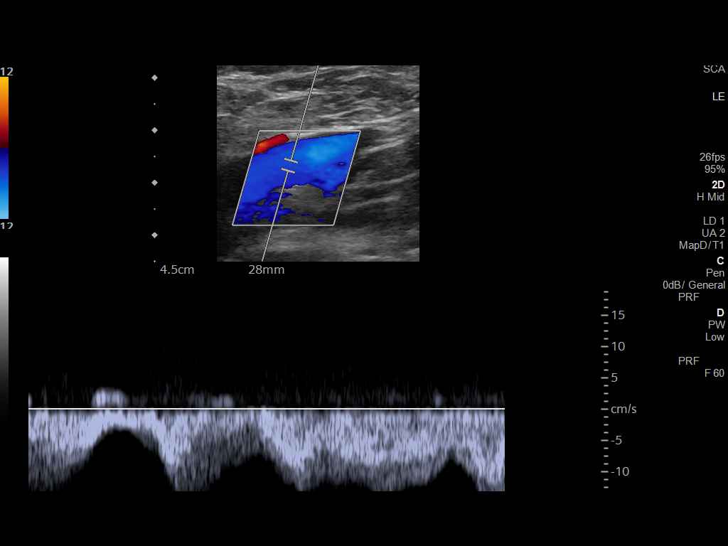
[im 11/42]
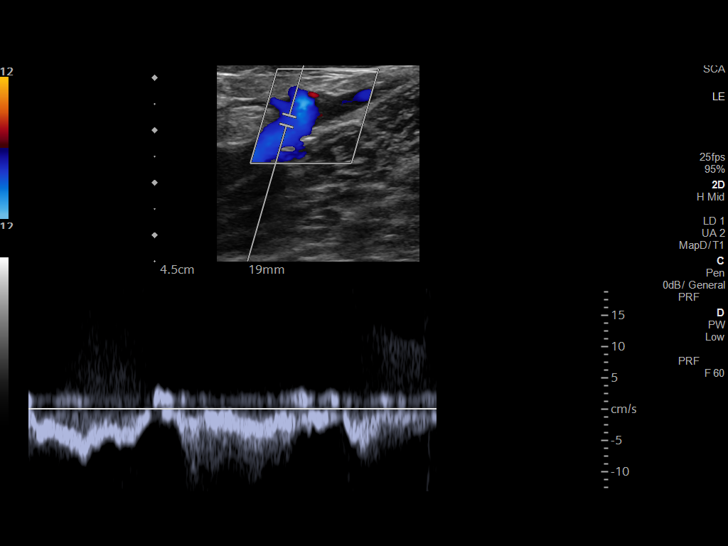
[im 15/42]
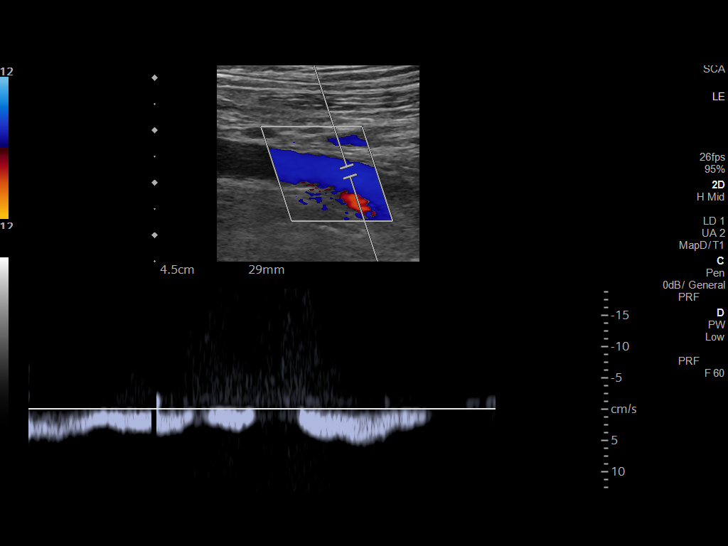
[im 18/42]
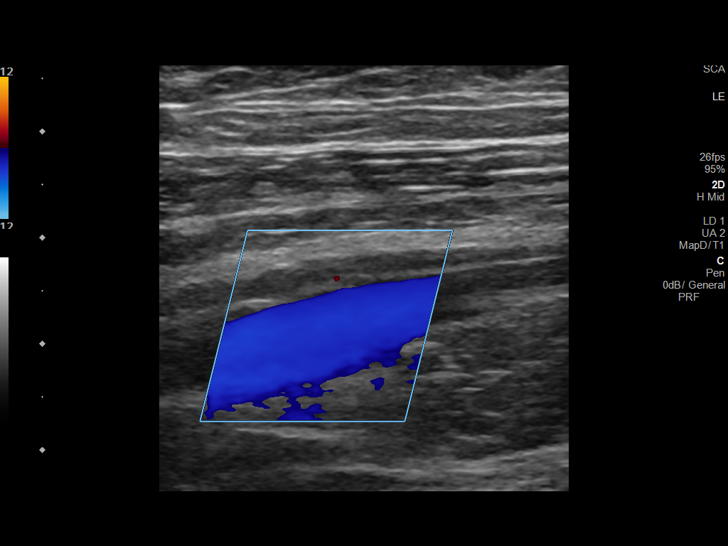
[im 22/42]
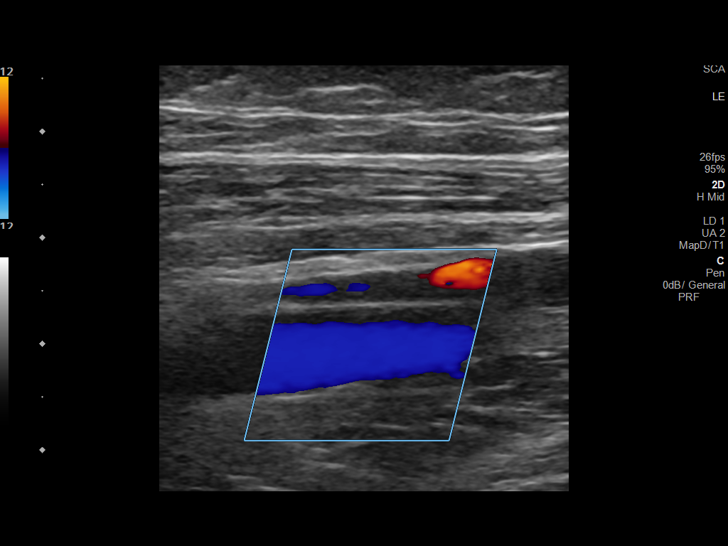
[im 24/42]
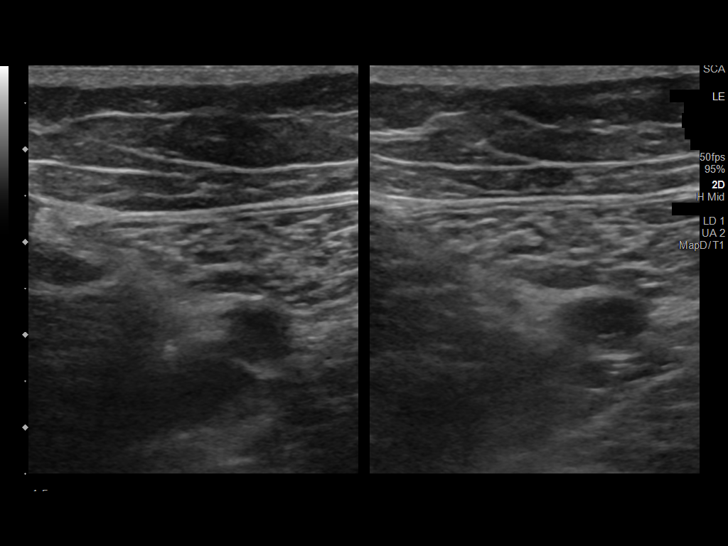
[im 27/42]
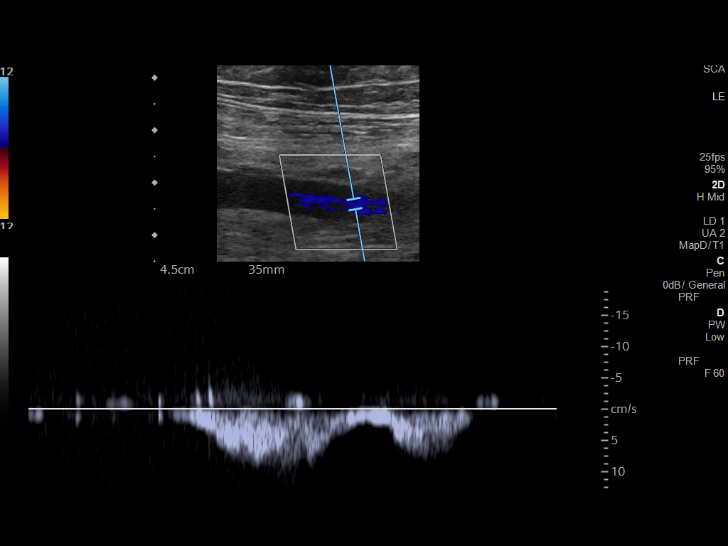
[im 31/42]
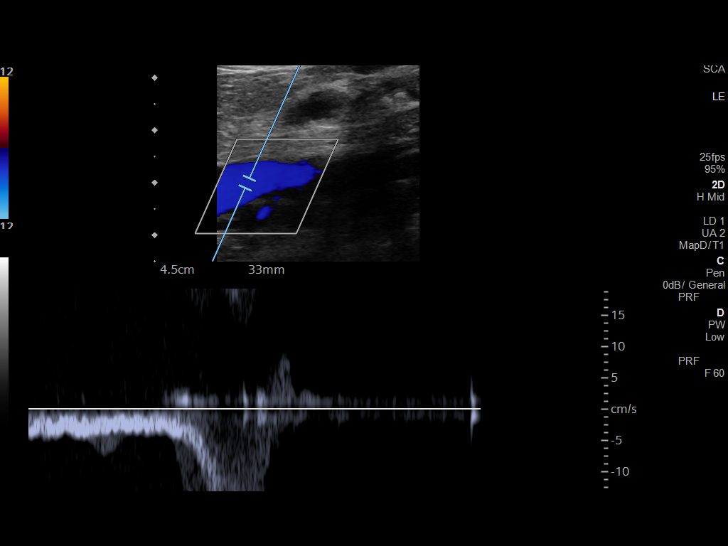
[im 34/42]
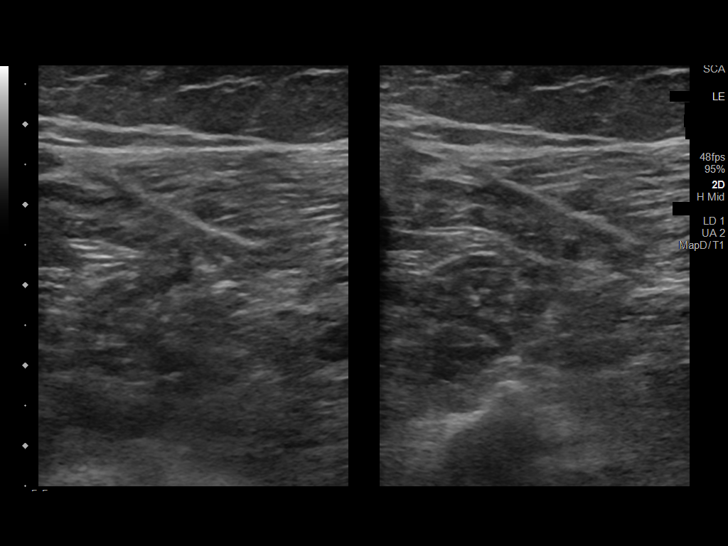
[im 38/42]
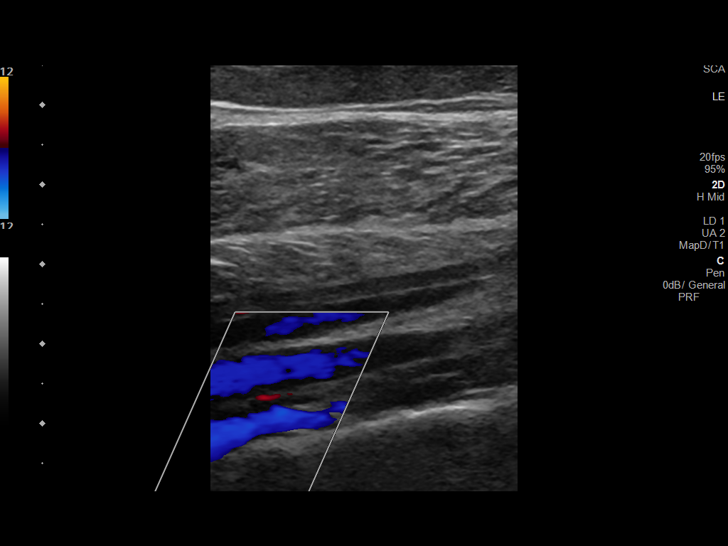
[im 42/42]
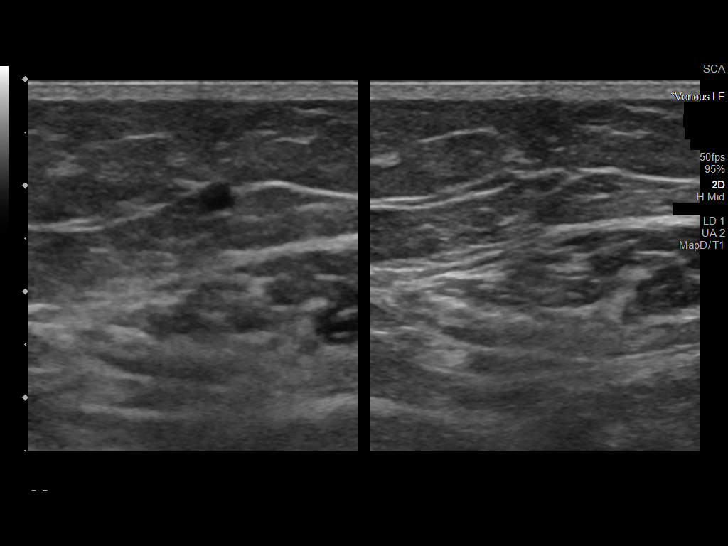

[13 of 24 positions shown; findings below may reference images not displayed]

FINDINGS: Contralateral Common Femoral Vein: Respiratory phasicity is normal
and symmetric with the symptomatic side. No evidence of thrombus.
Normal compressibility.

Common Femoral Vein: No evidence of thrombus. Normal
compressibility, respiratory phasicity and response to augmentation.

Saphenofemoral Junction: No evidence of thrombus. Normal
compressibility and flow on color Doppler imaging.

Profunda Femoral Vein: No evidence of thrombus. Normal
compressibility and flow on color Doppler imaging.

Femoral Vein: No evidence of thrombus. Normal compressibility,
respiratory phasicity and response to augmentation.

Popliteal Vein: No evidence of thrombus. Normal compressibility,
respiratory phasicity and response to augmentation.

Calf Veins: No evidence of thrombus. Normal compressibility and flow
on color Doppler imaging.

Other Findings:  None.
IMPRESSION: No evidence of deep venous thrombosis.

## 2023-02-15 ENCOUNTER — Telehealth: Payer: Self-pay | Admitting: Family Medicine

## 2023-02-15 NOTE — Telephone Encounter (Signed)
Received triage note.  "Caller states the room is spinning, she feels nauseous, and she feels like she's going to black out. She sees Dr. Reola Calkins. Vertigo and nausea started around 4 am. Constant"   They advised patient to be seen urgently in the next hour. Patient refused.   Tried to call patient to check on her. No answer. LVM to call back.

## 2023-02-15 NOTE — Telephone Encounter (Signed)
Pt called and stated she is feeling dizzy and wanted to be sen. Transferred to triage.

## 2023-02-18 NOTE — Telephone Encounter (Signed)
Initial Comment Caller states that the room is spinning, she feels nauseous, and she feels like she's going to black out. She sees Dr. Reola Calkins. Translation No Nurse Assessment Nurse: Nevin Bloodgood, RN, Brandi Date/Time (Eastern Time): 02/15/2023 11:08:32 AM Confirm and document reason for call. If symptomatic, describe symptoms. ---Vertigo and nausea started around 4am; constant. Does the patient have any new or worsening symptoms? ---Yes Will a triage be completed? ---Yes Related visit to physician within the last 2 weeks? ---N/A Does the PT have any chronic conditions? (i.e. diabetes, asthma, this includes High risk factors for pregnancy, etc.) ---Yes List chronic conditions. ---Elevated cholesterol; out of meds this month and missed the appt. Is this a behavioral health or substance abuse call? ---No Guidelines Guideline Title Affirmed Question Affirmed Notes Nurse Date/Time (Eastern Time) Dizziness - Vertigo [1] Dizziness (vertigo) present now AND [2] one or more STROKE RISK FACTORS (i.e., hypertension, diabetes, prior stroke/ TIA, heart attack) (Exception: Prior doctor or NP/PA evaluation for this Nevin Bloodgood, RN, Merry Proud 02/15/2023 11:09:59 AM PLEASE NOTE: All timestamps contained within this report are represented as [place] Standard Time. CONFIDENTIALTY NOTICE: This fax transmission is intended only for the addressee. It contains information that is legally privileged, confidential or otherwise protected from use or disclosure. If you are not the intended recipient, you are strictly prohibited from reviewing, disclosing, copying using or disseminating any of this information or taking any action in reliance on or regarding this information. If you have received this fax in error, please notify us immediately by telephone so that we can arrange for its return to [place]. Phone: [phone], Toll-Free: [phone], Fax: [phone] Page: 2 of 2 Call Id:  [id number] Guidelines Guideline Title Affirmed Question Affirmed Notes Nurse Date/Time Lamount Cohen Time) AND no different/ worse than usual.) Disp. Time Lamount Cohen Time) Disposition Final User 02/15/2023 11:05:20 AM Send to Urgent Queue Brooke Pace 02/15/2023 11:16:08 AM Go to ED Now (or PCP triage) Yes Nevin Bloodgood, RN, Brandi Final Disposition 02/15/2023 11:16:08 AM Go to ED Now (or PCP triage) Yes Nevin Bloodgood, RN, Sherian Maroon Disagree/Comply Disagree Caller Understands Yes PreDisposition Home Care Care Advice Given Per Guideline GO TO ED NOW (OR PCP TRIAGE): * IF NO PCP (PRIMARY CARE PROVIDER) SECOND-LEVEL TRIAGE: You need to be seen within the next hour. Go to the ED/UCC at _____________ Hospital. Leave as soon as you can. NOTE TO TRIAGER - DRIVING: * Another adult should drive. Referrals GO TO FACILITY REFUSED REFERRED TO PCP OFFICE

## 2023-02-18 NOTE — Telephone Encounter (Signed)
Received this Friday, as documented. Tried to call patient, no answer. LVM for her to call back.

## 2023-03-01 ENCOUNTER — Encounter: Payer: Self-pay | Admitting: Family Medicine

## 2023-03-01 ENCOUNTER — Ambulatory Visit (INDEPENDENT_AMBULATORY_CARE_PROVIDER_SITE_OTHER): Payer: 59 | Admitting: Family Medicine

## 2023-03-01 VITALS — BP 132/78 | HR 73 | Temp 97.6°F | Resp 18 | Ht 66.0 in | Wt 200.0 lb

## 2023-03-01 DIAGNOSIS — E669 Obesity, unspecified: Secondary | ICD-10-CM | POA: Diagnosis not present

## 2023-03-01 DIAGNOSIS — Z6831 Body mass index (BMI) 31.0-31.9, adult: Secondary | ICD-10-CM | POA: Diagnosis not present

## 2023-03-01 DIAGNOSIS — I739 Peripheral vascular disease, unspecified: Secondary | ICD-10-CM

## 2023-03-01 DIAGNOSIS — F419 Anxiety disorder, unspecified: Secondary | ICD-10-CM

## 2023-03-01 DIAGNOSIS — R739 Hyperglycemia, unspecified: Secondary | ICD-10-CM

## 2023-03-01 LAB — CBC WITH DIFFERENTIAL/PLATELET
Basophils Absolute: 0.1 10*3/uL (ref 0.0–0.1)
Basophils Relative: 0.7 % (ref 0.0–3.0)
Eosinophils Absolute: 0.1 10*3/uL (ref 0.0–0.7)
Eosinophils Relative: 1.2 % (ref 0.0–5.0)
HCT: 41.1 % (ref 36.0–46.0)
Hemoglobin: 13.6 g/dL (ref 12.0–15.0)
Lymphocytes Relative: 28.1 % (ref 12.0–46.0)
Lymphs Abs: 3.2 10*3/uL (ref 0.7–4.0)
MCHC: 33.2 g/dL (ref 30.0–36.0)
MCV: 97.8 fl (ref 78.0–100.0)
Monocytes Absolute: 0.7 10*3/uL (ref 0.1–1.0)
Monocytes Relative: 6.2 % (ref 3.0–12.0)
Neutro Abs: 7.2 10*3/uL (ref 1.4–7.7)
Neutrophils Relative %: 63.8 % (ref 43.0–77.0)
Platelets: 308 10*3/uL (ref 150.0–400.0)
RBC: 4.2 Mil/uL (ref 3.87–5.11)
RDW: 12.4 % (ref 11.5–15.5)
WBC: 11.3 10*3/uL — ABNORMAL HIGH (ref 4.0–10.5)

## 2023-03-01 LAB — COMPREHENSIVE METABOLIC PANEL
ALT: 34 U/L (ref 0–35)
AST: 27 U/L (ref 0–37)
Albumin: 4.5 g/dL (ref 3.5–5.2)
Alkaline Phosphatase: 77 U/L (ref 39–117)
BUN: 15 mg/dL (ref 6–23)
CO2: 28 mEq/L (ref 19–32)
Calcium: 9.5 mg/dL (ref 8.4–10.5)
Chloride: 101 mEq/L (ref 96–112)
Creatinine, Ser: 0.7 mg/dL (ref 0.40–1.20)
GFR: 95.86 mL/min (ref >60.00)
Glucose, Bld: 108 mg/dL — ABNORMAL HIGH (ref 70–99)
Potassium: 4.3 mEq/L (ref 3.5–5.1)
Sodium: 139 mEq/L (ref 135–145)
Total Bilirubin: 0.4 mg/dL (ref 0.2–1.2)
Total Protein: 7.1 g/dL (ref 6.0–8.3)

## 2023-03-01 LAB — HEMOGLOBIN A1C: Hgb A1c MFr Bld: 7 % — ABNORMAL HIGH (ref 4.6–6.5)

## 2023-03-01 LAB — LIPID PANEL
Cholesterol: 176 mg/dL (ref 0–200)
HDL: 37.5 mg/dL — ABNORMAL LOW (ref >39.00)
LDL Cholesterol: 101 mg/dL — ABNORMAL HIGH (ref 0–99)
NonHDL: 138.45
Total CHOL/HDL Ratio: 5
Triglycerides: 187 mg/dL — ABNORMAL HIGH (ref 0.0–149.0)
VLDL: 37.4 mg/dL (ref 0.0–40.0)

## 2023-03-01 LAB — TSH: TSH: 0.75 u[IU]/mL (ref 0.35–5.50)

## 2023-03-01 MED ORDER — ATORVASTATIN CALCIUM 20 MG PO TABS: 20.0000 mg | ORAL_TABLET | Freq: Every day | ORAL | 11 refills | Status: DC

## 2023-03-01 NOTE — Assessment & Plan Note (Addendum)
No SI/HI.  Not currently interested in SSRI/SNRI or counseling Continue lifestyle measures

## 2023-03-01 NOTE — Progress Notes (Signed)
Established Patient Office Visit  Subjective   Patient ID: Tiffany Forbes, female    DOB: 02/23/1966  Age: 57 y.o. MRN: [id number]  Chief Complaint  Patient presents with   Follow-up    Concerns/ questions: lightheadedness, dizziness on one occasion since last OV.  Shingrix- none Mammogram- has been ordered Colonoscopy- never    HPI   Discussed the use of AI scribe software for clinical note transcription with the patient, who gave verbal consent to proceed.  History of Present Illness   The patient, with a history of hyperlipidemia and prediabetes, presents for a regular follow-up. She reports running out of her Lipitor medication approximately a month ago but has been using leftover tablets. She expresses a willingness to have her cholesterol levels checked during this visit.  The patient has been working on lifestyle modifications for her elevated A1c levels, despite a recent weight gain of five pounds. 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  The patient also reports high energy levels and difficulty calming down, which she attributes to quitting smoking. She has been using Hydroxyzine for anxiety, which helps her sleep but is too sedating for daytime use.   The patient also mentions a need for a diet pill, expressing interest in Ozempic or Rybelsus, but is concerned about potential side effects. She is not opposed to medication but prefers to manage her health through lifestyle changes when possible.  The patient has a history of vascular issues but reports a successful follow-up with her vascular team and cessation of her blood thinner, now managing with aspirin. She also mentions a need to reschedule a mammogram and expresses nervousness about undergoing a colonoscopy, despite a family history of colon cancer.            ROS All review of systems  negative except what is listed in the HPI    Objective:     BP 132/78 (BP Location: Right Arm, Patient Position: Sitting, Cuff Size: Normal)   Pulse 73   Temp 97.6 F (36.4 C) (Oral)   Resp 18   Ht 5\' 6"  (1.676 m)   Wt 200 lb (90.7 kg)   SpO2 98%   BMI 32.28 kg/m    Physical Exam Vitals reviewed.  Constitutional:      Appearance: Normal appearance. She is obese.  Cardiovascular:     Rate and Rhythm: Normal rate and regular rhythm.     Pulses: Normal pulses.     Heart sounds: Normal heart sounds.  Pulmonary:     Effort: Pulmonary effort is normal.     Breath sounds: Normal breath sounds.  Musculoskeletal:     Right lower leg: No edema.     Left lower leg: No edema.  Skin:    General: Skin is warm and dry.  Neurological:     Mental Status: She is alert and oriented to person, place, and time.  Psychiatric:        Mood and Affect: Mood normal.        Behavior: Behavior normal.        Thought Content: Thought content normal.        Judgment: Judgment normal.      No results found for any visits on 03/01/23.    The 10-year ASCVD risk score (Arnett DK, et al., 2019) is: 2.7%    Assessment & Plan:   Problem List Items Addressed This  Visit     Anxiety    No SI/HI.  Not currently interested in SSRI/SNRI or counseling Continue lifestyle measures      Class 1 obesity with body mass index (BMI) of 31.0 to 31.9 in adult    Labs today Encouraged healthy lifestyle measures      Relevant Orders   CBC with Differential/Platelet   TSH   Lipid panel   PAD (peripheral artery disease) (HCC)    Following with vascular. Continuing ASA, no longer on Plavix. No new symptoms or concerns.       Relevant Medications   atorvastatin (LIPITOR) 20 MG tablet   Other Relevant Orders   Lipid panel   Hyperglycemia - Primary    Lab Results  Component Value Date   HGBA1C 6.9 (H) 10/12/2022  She has preferred focusing on lifestyle measures, but is open to medication if  needed, though she does not want metformin.  Labs today. Discussed healthy lifestyle.       Relevant Orders   Comprehensive metabolic panel   Hemoglobin A1c    Return in about 3 months (around 06/01/2023) for routine follow-up.    Clayborne Dana, NP

## 2023-03-01 NOTE — Assessment & Plan Note (Signed)
Lab Results  Component Value Date   HGBA1C 6.9 (H) 10/12/2022  She has preferred focusing on lifestyle measures, but is open to medication if needed, though she does not want metformin.  Labs today. Discussed healthy lifestyle.

## 2023-03-01 NOTE — Assessment & Plan Note (Signed)
Following with vascular. Continuing ASA, no longer on Plavix. No new symptoms or concerns.

## 2023-03-01 NOTE — Assessment & Plan Note (Signed)
Labs today Encouraged healthy lifestyle measures

## 2023-03-07 NOTE — Progress Notes (Signed)
WBC mildly elevated, could be reactive.  A1c is about the same. If you want to continue with lifestyle measures for now, I think that is reasonable. If you decide you are ready to try medications let me know. Otherwise, we can check A1c again in 3-6 months. Triglycerides are mildly elevated. Continue Lipitor and healthy lifestyle  The 10-year ASCVD risk score (Arnett DK, et al., 2019) is: 3.1%   Values used to calculate the score:     Age: 57 years     Sex: Female     Is Non-Hispanic African American: No     Diabetic: No     Tobacco smoker: No     Systolic Blood Pressure: 132 mmHg     Is BP treated: No     HDL Cholesterol: 37.5 mg/dL     Total Cholesterol: 176 mg/dL

## 2024-02-18 NOTE — Progress Notes (Signed)
 [place]    Forbes ID: Tiffany Forbes is a 58 y.o. female  1966-03-03  [facility]- Jenel Hammersmith [address]  [address] [place], [place]  [address]  Phone: [phone]  Requesting Provider:  No ref. provider found  Assessment/Plan:    1. Cellulitis of face  clotrimazole-betamethasone (LOTRISONE) 1-0.05 % cream      Dear  Thank you for allowing me to participate in your Forbes's care, Tiffany Forbes.    This is a Forbes who I evaluated today at your request.   If you have any questions or concerns please call my office. I would be happy to speak with you.     Thank you again for involving me in your patients care.  Sincerely,  Tiffany Forbes, M.D.  [facility] [address] [place], [place]  [address]  Phone  [phone] Fax      [phone]  Subjective:   Reason for visit:    Chief Complaint  Forbes presents with   New Forbes    Tiffany Forbes is a new Forbes that presents today for evaluation of itchy ears.     History of Present Illness:  Tiffany Forbes is a 58 y.o. year old female who is a new Forbes that presents today for evaluation of itchy ears. Forbes reports a couple month history of itchy, red, flaky ears. Forbes has tried a course of Clindamycin and Mupirocin.  History of Present Illness Tiffany Forbes is a 58 year old female who is seen for Tiffany first time today. She is here for itchy ears with flaky skin.  She began experiencing severe itching in her right ear a few months ago, which has progressively worsened. Tiffany skin in Tiffany area is extremely dry and peeling, with a liquid discharge from Tiffany top of her ear into Tiffany ear canal. She reports no pain associated with these symptoms. This is her first experience with such symptoms. 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 Tiffany itching intensifies at night, causing irritation. Despite some improvement, she continues to experience discomfort. She reports no trauma to Tiffany ear or insect bites. She suspects that her condition might be related to stress from her job. She has not consulted a dermatologist yet. She has been using mupirocin ointment, which has provided some relief, but Tiffany symptoms persist even after 3 weeks of treatment.  She has been diagnosed with prediabetes but is not on any medication for it.  Tiffany Forbes denies chest pain, shortness of breath, abdominal pain or GI upset.  Critical exam findings and Assessment and Plan:  Assessment & Plan 1. Right ear cellulitis, now improving.  Tiffany symptoms suggest a localized skin infection/cellulitis, which could have originated from a minor pimple and subsequently spread. Tiffany skin appears slightly erythematous, indicating a potential yeast infection following antibiotic treatment. A prescription for a topical cream containing hydrocortisone and an antifungal agent will be provided. Apply this cream to Tiffany affected area using a Q-tip for a duration of 5 days.   Continuation of current medications is recommended. If Tiffany condition exacerbates, a referral to a dermatologist will be considered. Risks, benefits, and alternatives of treatment were discussed, including Tiffany potential for yeast infections following antibiotic use and Tiffany effectiveness of Tiffany prescribed topical cream  in treating both inflammation and fungal infections.  Results   PPE that was worn during Tiffany encounter was gloves.  Objective:   Past Medical History: Medical History[1]  Past Surgical History:. Surgical History[2] Medications: Current Medications[3]  Allergies: Allergies[4]  Family History: Family History[5]  Social History: Social History   Social History Narrative   Not on file   Tobacco Use  History[6]  Review of Systems:  A complete review of systems was performed with positive and negative pertinent findings listed in Tiffany history of present illness.  All other systems are negative  VITAL SIGNS: BP 133/84 (BP Location: Left arm, Forbes Position: Sitting)   Pulse 76   Temp 98.1 F (36.7 C)   Ht 1.676 m (5' 6)   Wt 76.7 kg (169 lb)   BMI 27.28 kg/m   Physical Exam:   Physical Exam Ears: no infection in Tiffany middle ear.  Right ear with some erythema and edema.  Neck: No abnormalities noted. Skin: Redness noted on Tiffany skin around Tiffany right ear.  General: pleasant, sitting up in NAD, normal appearance and voice. Respiration:  Breathing comfortably, no stridor.  Eyes:  EOM Intact, sclera anicteric, no conjunctival injection.  Neuro:  A&Ox3, Cranial nerves 2-12 intact and symmetric.  Facial Nerve intact. Normal affect.  Integumentary: Skin of Tiffany face and neck with no masses or lesions Salivary Glands:  Parotid and submandibular glands normal bilaterally. No masses or palpable abnormalities. Clear saliva from submandibular and parotid ducts.   Ears:  External Ears are normal.  normal hearing to whispered voice.   Nose:  External nose midline  Oral Cavity/Lips: normal mucosa, no lesions, tongue mobile,  healthy dentition and gingiva Oropharynx:  no masses or lesions.  BOT soft, soft palate elevates symmetrically in Tiffany midline, tonsils  Pharyngeal Walls and Nasopharynx:  No masses noted. Neck/Lymph:  No lymphadenopathy,  No neck or thyroid masses, trachea is in Tiffany midline.  Procedures  This document serves as a record of services personally performed by Dr. Lamar Forbes.  It was created on their behalf by Tiffany Forbes, Tiffany Forbes, a trained medical scribe, and Certified Medical Assistant (Tiffany Forbes). During Tiffany course of documenting Tiffany history, physical exam and medical decision making, I was functioning as a stage manager. Tiffany creation of this record is Tiffany  providers dictation and/or activities during Tiffany visit.  Electronically signed by Tiffany Forbes, Tiffany Forbes 02/18/2024 8:56 AM    I agree Tiffany documentation is accurate and complete.  Tiffany Tamar Forbes PONCE, MD MD        [1] History reviewed. No pertinent past medical history. [2] Past Surgical History: Procedure Laterality Date   HAND SURGERY     Procedure: HAND SURGERY   TONSILLECTOMY    [3] Current Outpatient Medications  Medication Sig Dispense Refill   clindamycin (CLEOCIN) 300 mg capsule Take 1 capsule by mouth in Tiffany morning and 1 capsule at noon and 1 capsule in Tiffany evening. Take with meals.     mupirocin (BACTROBAN) 2 % ointment APPLY OINTMENT TOPICALLY TO AFFECTED AREA TWICE DAILY     clotrimazole-betamethasone (LOTRISONE) 1-0.05 % cream Apply 1 Application topically 2 (two) times a day for 5 days. Apply to right ear twice daily 15 g 0   No current facility-administered medications for this visit.  [4] No Known Allergies [5] Family History Problem Relation Name Age of Onset   Breast cancer Neg Hx    [6] Social History Tobacco Use  Smoking Status Former  Current packs/day: 1.00   Types: Cigarettes  Smokeless Tobacco Never

## 2024-08-25 ENCOUNTER — Encounter (HOSPITAL_BASED_OUTPATIENT_CLINIC_OR_DEPARTMENT_OTHER): Payer: Self-pay

## 2024-08-25 ENCOUNTER — Other Ambulatory Visit: Payer: Self-pay

## 2024-08-25 ENCOUNTER — Emergency Department (HOSPITAL_BASED_OUTPATIENT_CLINIC_OR_DEPARTMENT_OTHER)

## 2024-08-25 ENCOUNTER — Emergency Department (HOSPITAL_BASED_OUTPATIENT_CLINIC_OR_DEPARTMENT_OTHER)
Admission: EM | Admit: 2024-08-25 | Discharge: 2024-08-25 | Disposition: A | Discharge status: Home / Self Care | Attending: Emergency Medicine | Admitting: Emergency Medicine

## 2024-08-25 DIAGNOSIS — I739 Peripheral vascular disease, unspecified: Secondary | ICD-10-CM | POA: Insufficient documentation

## 2024-08-25 DIAGNOSIS — F17200 Nicotine dependence, unspecified, uncomplicated: Secondary | ICD-10-CM | POA: Diagnosis not present

## 2024-08-25 DIAGNOSIS — M79672 Pain in left foot: Secondary | ICD-10-CM | POA: Diagnosis present

## 2024-08-25 DIAGNOSIS — L819 Disorder of pigmentation, unspecified: Secondary | ICD-10-CM

## 2024-08-25 DIAGNOSIS — Z7982 Long term (current) use of aspirin: Secondary | ICD-10-CM | POA: Diagnosis not present

## 2024-08-25 LAB — CBC WITH DIFFERENTIAL/PLATELET
Abs Immature Granulocytes: 0.08 10*3/uL — ABNORMAL HIGH (ref 0.00–0.07)
Basophils Absolute: 0.1 10*3/uL (ref 0.0–0.1)
Basophils Relative: 1 %
Eosinophils Absolute: 0.1 10*3/uL (ref 0.0–0.5)
Eosinophils Relative: 1 %
HCT: 42.4 % (ref 36.0–46.0)
Hemoglobin: 14.5 g/dL (ref 12.0–15.0)
Immature Granulocytes: 1 %
Lymphocytes Relative: 29 %
Lymphs Abs: 3.3 10*3/uL (ref 0.7–4.0)
MCH: 32.3 pg (ref 26.0–34.0)
MCHC: 34.2 g/dL (ref 30.0–36.0)
MCV: 94.4 fL (ref 80.0–100.0)
Monocytes Absolute: 0.6 10*3/uL (ref 0.1–1.0)
Monocytes Relative: 5 %
Neutro Abs: 7.2 10*3/uL (ref 1.7–7.7)
Neutrophils Relative %: 63 %
Platelets: 266 10*3/uL (ref 150–400)
RBC: 4.49 MIL/uL (ref 3.87–5.11)
RDW: 11.8 % (ref 11.5–15.5)
WBC: 11.2 10*3/uL — ABNORMAL HIGH (ref 4.0–10.5)
nRBC: 0 % (ref 0.0–0.2)

## 2024-08-25 LAB — BASIC METABOLIC PANEL WITH GFR
Anion gap: 13 (ref 5–15)
BUN: 11 mg/dL (ref 6–20)
CO2: 26 mmol/L (ref 22–32)
Calcium: 9.2 mg/dL (ref 8.9–10.3)
Chloride: 100 mmol/L (ref 98–111)
Creatinine, Ser: 0.63 mg/dL (ref 0.44–1.00)
GFR, Estimated: >60 mL/min
Glucose, Bld: 84 mg/dL (ref 70–99)
Potassium: 3.8 mmol/L (ref 3.5–5.1)
Sodium: 139 mmol/L (ref 135–145)

## 2024-08-25 MED ORDER — IOHEXOL 350 MG/ML SOLN
100.0000 mL | Freq: Once | INTRAVENOUS | Status: AC | PRN
  Administered 2024-08-25: 100 mL via INTRAVENOUS

## 2024-08-25 MED ORDER — ATORVASTATIN CALCIUM 20 MG PO TABS: 20.0000 mg | ORAL_TABLET | Freq: Every day | ORAL | 0 refills | Status: AC

## 2024-08-25 MED ORDER — ASPIRIN EC 81 MG PO TBEC: 81.0000 mg | DELAYED_RELEASE_TABLET | Freq: Every day | ORAL | 0 refills | Status: AC

## 2024-08-25 NOTE — ED Notes (Signed)
 Discharge instructions reviewed with patient. Patient verbalizes understanding, no further questions at this time. Medications/prescriptions and follow up information provided. No acute distress noted at time of departure.

## 2024-08-25 NOTE — ED Provider Notes (Signed)
 " [place] [facility] Provider Note   CSN: [id number] Arrival date & time: 08/25/24  1137     Patient presents with: Leg Pain   Tiffany Forbes is a 59 y.o. female.   Patient is a 59 year old female with a past medical history of PAD with stents in her right leg, hyperlipidemia and tobacco use presenting to the emergency department with left foot pain and toe discoloration.  The patient states around 3 days ago she started to notice that her left pinky toe was turning purple and blue.  She states that last night she started to develop a severe burning pain in her foot.  She denies any associated numbness or weakness  She states that her symptoms feel similar to when she needed a stent placed in her right leg.  She states that she has been having some claudication pain in the right with walking.  She states that she did recently start smoking again in the last 3 months.  The history is provided by the patient.  Leg Pain      Prior to Admission medications  Medication Sig Start Date End Date Taking? Authorizing Provider  aspirin  EC 81 MG tablet Take 1 tablet (81 mg total) by mouth daily. Swallow whole. 08/25/24   Tiffany Forbes, Tiffany Reza Forbes, Tiffany Forbes  atorvastatin  (LIPITOR) 20 MG tablet Take 1 tablet (20 mg total) by mouth daily. 08/25/24   Tiffany Forbes, Tiffany Ferdinand Forbes, Tiffany Forbes  hydrOXYzine  (ATARAX ) 25 MG tablet Take 1 tablet (25 mg total) by mouth 3 (three) times daily as needed. 10/12/22   Tiffany Waddell NOVAK, Tiffany Forbes    Allergies: Patient has no known allergies.    Review of Systems  Updated Vital Signs BP (!) 166/85 (BP Location: Left Arm)   Pulse 93   Temp 97.9 F (36.6 C) (Oral)   Resp 17   SpO2 96%   Physical Exam Vitals and nursing note reviewed.  Constitutional:      General: She is not in acute distress.    Appearance: Normal appearance.  HENT:     Head: Normocephalic.     Nose: Nose normal.     Mouth/Throat:     Mouth: Mucous membranes are moist.  Eyes:      Extraocular Movements: Extraocular movements intact.     Conjunctiva/sclera: Conjunctivae normal.  Cardiovascular:     Rate and Rhythm: Normal rate and regular rhythm.     Heart sounds: Normal heart sounds.     Comments: 1+ bilateral DP pulses Pulmonary:     Effort: Pulmonary effort is normal.     Breath sounds: Normal breath sounds.  Abdominal:     General: Abdomen is flat.  Musculoskeletal:        General: Normal range of motion.     Cervical back: Normal range of motion.     Right lower leg: No edema.     Left lower leg: No edema.  Skin:    General: Skin is warm and dry.     Comments: Blue discoloration to L 4th and 5th toes   Neurological:     General: No focal deficit present.     Mental Status: She is alert and oriented to person, place, and time.  Psychiatric:        Mood and Affect: Mood normal.        Behavior: Behavior normal.        (all labs ordered are listed, but only abnormal results are displayed) Labs Reviewed  CBC WITH DIFFERENTIAL/PLATELET - Abnormal; Notable for the following components:      Result Value   WBC 11.2 (*)    Abs Immature Granulocytes 0.08 (*)    All other components within normal limits  BASIC METABOLIC PANEL WITH GFR    EKG: None  Radiology: CT ANGIO AO+BIFEM W & OR WO CONTRAST Result Date: 08/25/2024 CLINICAL DATA:  59 year old female with history of bilateral lower extremity pain concerning for ischemia. EXAM: CT ANGIOGRAPHY OF ABDOMINAL AORTA WITH ILIOFEMORAL RUNOFF TECHNIQUE: Multidetector CT imaging of the abdomen, pelvis and lower extremities was performed using the standard protocol during bolus administration of intravenous contrast. Multiplanar CT image reconstructions and MIPs were obtained to evaluate the vascular anatomy. RADIATION DOSE REDUCTION: This exam was performed according to the departmental dose-optimization program which includes automated exposure control, adjustment of the mA and/or kV according to patient size  and/or use of iterative reconstruction technique. CONTRAST:  OMNIPAQUE  IOHEXOL  350 MG/ML SOLN COMPARISON:  None Available. FINDINGS: VASCULAR Aorta: Normal caliber aorta without aneurysm, dissection, vasculitis or significant stenosis. Scattered atherosclerotic calcifications, most prominent in the infrarenal segment. Celiac: Patent without evidence of aneurysm, dissection, vasculitis or significant stenosis. SMA: Patent without evidence of aneurysm, dissection, vasculitis or significant stenosis. Renals: Dual bilateral renal arteries are patent without evidence of aneurysm, dissection, vasculitis, fibromuscular dysplasia or significant stenosis. IMA: Patent without evidence of aneurysm, dissection, vasculitis or significant stenosis. RIGHT Lower Extremity Inflow: Common, internal and external iliac arteries are patent without evidence of aneurysm, dissection, vasculitis or significant stenosis. Mixed fibrofatty and calcific atherosclerotic changes about the common iliac artery without evidence of flow-limiting stenosis. Outflow: The common femoral artery is patent. The profunda is patent. The proximal superficial femoral artery is patent with gradual tapering to occlusion approximately 1.7 cm proximal to the indwelling stent which appears occluded proximally with a degree of distal opacification. The popliteal artery is patent. Runoff: Proximal three-vessel runoff with gradual tapering of each vessel about the mid right lower extremity likely secondary to contrast bolus timing. LEFT Lower Extremity Inflow: Common, internal and external iliac arteries are patent without evidence of aneurysm, dissection, vasculitis or significant stenosis. Mixed fibrofatty and calcific atherosclerotic changes about the common iliac artery without evidence of flow-limiting stenosis. Outflow: The common, profundal, and proximal to mid superficial femoral arteries are patent. There is punctate atherosclerotic calcification at the  level of Hunter's canal with associated mild stenosis. The popliteal artery is patent. Runoff: Three-vessel runoff is patent proximally. There is patency of the popliteal and posterior tibial arteries to the level of the ankle. There is gradual tapering of the diminutive anterior tibial artery in the mid left lower extremity. Veins: No obvious venous abnormality within the limitations of this arterial phase study. Review of the MIP images confirms the above findings. NON-VASCULAR Lower chest: Heart is normal in size. No pericardial effusion. Left lower lobe bronchiectasis and bronchial wall thickening with associated centrilobular emphysematous changes. Hepatobiliary: No focal liver abnormality is seen. No gallstones, gallbladder wall thickening, or biliary dilatation. Pancreas: Unremarkable. No pancreatic ductal dilatation or surrounding inflammatory changes. Spleen: Normal in size without focal abnormality. Adrenals/Urinary Tract: Adrenal glands are unremarkable. Kidneys are normal, without renal calculi, focal lesion, or hydronephrosis. Bladder is unremarkable. Stomach/Bowel: Stomach is within normal limits. Appendix appears normal. No evidence of bowel wall thickening, distention, or inflammatory changes. Lymphatic: No abdominopelvic lymphadenopathy. Reproductive: Uterus and bilateral adnexa are unremarkable. Other: No abdominal wall hernia or abnormality. No abdominopelvic ascites. Musculoskeletal: No acute or significant osseous  findings. IMPRESSION: VASCULAR 1. 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 Otherwise patent inflow, outflow, and 3 vessel runoff proximally. Poor visualization of the distal runoff vessels due to contrast bolus timing. 2. Left lower extremity: Patent inflow, outflow, and 2 vessel runoff to the level of the ankle. 3.  Aortic  Atherosclerosis (ICD10-I70.0). NON-VASCULAR 1. No acute abdominopelvic abnormality. 2. Chronic appearing asymmetric emphysematous changes about the left lower lobe pulmonary parenchymal with associated bronchiectasis. Tiffany Sides, MD [facility] Electronically Signed   By: Tiffany Forbes M.D.   On: 08/25/2024 14:40     Procedures   Medications Ordered in the ED  iohexol  (OMNIPAQUE ) 350 MG/ML injection 100 mL (100 mLs Intravenous Contrast Given 08/25/24 1346)    Clinical Course as of 08/25/24 1456  Tue Aug 25, 2024  1446 CT with patent vasculature in bilateral LE, chronic appearing occlusion on the R with distal flow. Patient recommended to follow up with outpatient vascular for further management. [VK]  1453 Patient states she was supposed to be on atorvastatin  and baby aspirin  but stopped when she didn't have insurance. Will refill these pending her follow up. Also counseled on stopping smoking.  [VK]    Clinical Course User Index [VK] Tiffany Forbes, Deziyah Arvin Forbes, Tiffany Forbes                                 Medical Decision Making This patient presents to the ED with chief complaint(s) of L foot pain with pertinent past medical history of PAD, HLD, tobacco use which further complicates the presenting complaint. The complaint involves an extensive differential diagnosis and also carries with it a high risk of complications and morbidity.    The differential diagnosis includes limb ischemia, claudication, no trauma making fracture or dislocation less likely  Additional history obtained: Additional history obtained from N/A Records reviewed outpatient vascular records  ED Course and Reassessment: On patient's arrival she is mildly hypertensive and otherwise hemodynamically stable in no acute distress.  She does have blue discoloration to her left pinky toe and part of her fourth toe.  She does have 1+ DP pulses and strength and sensation is  intact, however with her history and color change concern for possible ischemia.  The patient will have CT angio of her lower extremities and will be closely reassessed.  Independent labs interpretation:  The following labs were independently interpreted: within normal range  Independent visualization of imaging: - I independently visualized the following imaging with scope of interpretation limited to determining acute life threatening conditions related to emergency care: CT angio LE, which revealed patent vasculature   Consultation: - Consulted or discussed management/test interpretation w/ external professional: N/A  Consideration for admission or further workup: Patient has no emergent conditions requiring admission or further work-up at this time and is stable for discharge home with vascular follow-up  Social Determinants of health: N/A    Amount and/or Complexity of Data Reviewed Labs: ordered. Radiology: ordered.  Risk OTC drugs. Prescription drug management.       Final diagnoses:  Discoloration of skin of toe  PAD (peripheral artery disease)    ED Discharge Orders          Ordered    aspirin  EC 81 MG tablet  Daily        08/25/24  1454    atorvastatin  (LIPITOR) 20 MG tablet  Daily        08/25/24 1454               Tiffany Forbes, Veneta Sliter Forbes, [place] 08/25/24 1456  "

## 2024-08-25 NOTE — Discharge Instructions (Addendum)
 You were seen in the emergency department for your toe discoloration.  Your CT scan shows that you have some plaque buildup but no complete blockages of your blood vessels.  You should call your vascular doctor and schedule follow-up for further workup and evaluation and I have given you a refill of your aspirin  and cholesterol medication that you should start taking daily again as prescribed.  You should try to cut back on cigarettes as well to help prevent worsening vascular disease.  You should return to the emergency department if you are having significantly worsening pain, you develop numbness or weakness in your leg, you are having worsening discoloration to your foot or any other new or concerning symptoms.

## 2024-08-25 NOTE — ED Triage Notes (Signed)
 Reports worsening L foot pain for 5 days, purple discoloration noted to L pinky toe. Pedal pulse palpable. Cap refill greater than 3 sec. Foot is cold to touch   Also reports R calf pain. Hx of having stent placement 2 years ago. Taken off blood thinners 1 year ago, started smoking again 3 months ago

## 2024-08-25 NOTE — ED Notes (Signed)
 Pt. Here due to her L pinky toe is purple in color and her L foot is cold to touch.  Pt. Reports she has had the cold sensation and color change in the Pinky toe Left since last Thursday.  Last night per Pt. The pain in the L pinky toe was unbearable.  She reports a stinging and burning sensation.

## 2024-09-01 ENCOUNTER — Ambulatory Visit (HOSPITAL_COMMUNITY)

## 2024-09-01 ENCOUNTER — Telehealth: Payer: Self-pay

## 2024-09-01 ENCOUNTER — Other Ambulatory Visit: Payer: Self-pay

## 2024-09-01 DIAGNOSIS — I739 Peripheral vascular disease, unspecified: Secondary | ICD-10-CM

## 2024-09-02 ENCOUNTER — Ambulatory Visit

## 2024-09-07 ENCOUNTER — Ambulatory Visit (HOSPITAL_COMMUNITY)

## 2024-09-07 ENCOUNTER — Ambulatory Visit (HOSPITAL_COMMUNITY): Admission: RE | Admit: 2024-09-07

## 2024-09-08 ENCOUNTER — Ambulatory Visit
